# Patient Record
Sex: Male | Born: 1985 | Race: Black or African American | Hispanic: No | Marital: Married | Smoking: Never smoker
Health system: Southern US, Community
[De-identification: ages and names within clinical notes are randomized; demographics above are authoritative.]

## PROBLEM LIST (undated history)

## (undated) DIAGNOSIS — E119 Type 2 diabetes mellitus without complications: Secondary | ICD-10-CM

## (undated) DIAGNOSIS — F4310 Post-traumatic stress disorder, unspecified: Secondary | ICD-10-CM

## (undated) DIAGNOSIS — F3163 Bipolar disorder, current episode mixed, severe, without psychotic features: Secondary | ICD-10-CM

---
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
## 2007-11-16 ENCOUNTER — Emergency Department (HOSPITAL_COMMUNITY): Admission: EM | Admit: 2007-11-16 | Discharge: 2007-11-17 | Payer: Self-pay | Admitting: Emergency Medicine

## 2011-05-31 LAB — DIFFERENTIAL
Basophils Absolute: 0
Basophils Relative: 0
Eosinophils Absolute: 0
Eosinophils Relative: 0
Lymphocytes Relative: 4 — ABNORMAL LOW
Lymphs Abs: 0.3 — ABNORMAL LOW
Monocytes Absolute: 0.3
Monocytes Relative: 4
Neutro Abs: 7.1
Neutrophils Relative %: 91 — ABNORMAL HIGH

## 2011-05-31 LAB — COMPREHENSIVE METABOLIC PANEL
ALT: 29
AST: 30
Albumin: 4.5
Alkaline Phosphatase: 48
BUN: 16
CO2: 26
Calcium: 9.4
Chloride: 98
Creatinine, Ser: 1.27
GFR calc Af Amer: >60
GFR calc non Af Amer: >60
Glucose, Bld: 89
Potassium: 3.3 — ABNORMAL LOW
Sodium: 136
Total Bilirubin: 1.8 — ABNORMAL HIGH
Total Protein: 7.6

## 2011-05-31 LAB — LIPASE, BLOOD: Lipase: 18

## 2011-05-31 LAB — CBC
HCT: 50.3
Hemoglobin: 17.2 — ABNORMAL HIGH
MCHC: 34.2
MCV: 85.3
Platelets: 214
RBC: 5.9 — ABNORMAL HIGH
RDW: 12.9
WBC: 7.8

## 2018-08-12 ENCOUNTER — Emergency Department (HOSPITAL_COMMUNITY)
Admission: EM | Admit: 2018-08-12 | Discharge: 2018-08-13 | Disposition: A | Discharge status: Home / Self Care | Payer: Self-pay | Attending: Emergency Medicine | Admitting: Emergency Medicine

## 2018-08-12 ENCOUNTER — Other Ambulatory Visit: Payer: Self-pay

## 2018-08-12 DIAGNOSIS — F4310 Post-traumatic stress disorder, unspecified: Secondary | ICD-10-CM | POA: Insufficient documentation

## 2018-08-12 DIAGNOSIS — Z79899 Other long term (current) drug therapy: Secondary | ICD-10-CM | POA: Insufficient documentation

## 2018-08-12 DIAGNOSIS — F3113 Bipolar disorder, current episode manic without psychotic features, severe: Secondary | ICD-10-CM | POA: Insufficient documentation

## 2018-08-12 DIAGNOSIS — F3110 Bipolar disorder, current episode manic without psychotic features, unspecified: Secondary | ICD-10-CM | POA: Diagnosis present

## 2018-08-12 LAB — RAPID URINE DRUG SCREEN, HOSP PERFORMED
Amphetamines: NOT DETECTED
BENZODIAZEPINES: NOT DETECTED
Barbiturates: NOT DETECTED
Cocaine: NOT DETECTED
OPIATES: NOT DETECTED
TETRAHYDROCANNABINOL: POSITIVE — AB

## 2018-08-12 LAB — CBC WITH DIFFERENTIAL/PLATELET
ABS IMMATURE GRANULOCYTES: 0.02 10*3/uL (ref 0.00–0.07)
Basophils Absolute: 0 10*3/uL (ref 0.0–0.1)
Basophils Relative: 1 %
Eosinophils Absolute: 0 10*3/uL (ref 0.0–0.5)
Eosinophils Relative: 0 %
HEMATOCRIT: 49.6 % (ref 39.0–52.0)
Hemoglobin: 16.7 g/dL (ref 13.0–17.0)
IMMATURE GRANULOCYTES: 0 %
LYMPHS ABS: 1.8 10*3/uL (ref 0.7–4.0)
Lymphocytes Relative: 23 %
MCH: 29.5 pg (ref 26.0–34.0)
MCHC: 33.7 g/dL (ref 30.0–36.0)
MCV: 87.5 fL (ref 80.0–100.0)
MONO ABS: 0.5 10*3/uL (ref 0.1–1.0)
MONOS PCT: 6 %
NEUTROS ABS: 5.7 10*3/uL (ref 1.7–7.7)
NEUTROS PCT: 70 %
Platelets: 240 10*3/uL (ref 150–400)
RBC: 5.67 MIL/uL (ref 4.22–5.81)
RDW: 12.2 % (ref 11.5–15.5)
WBC: 8.1 10*3/uL (ref 4.0–10.5)
nRBC: 0 % (ref 0.0–0.2)

## 2018-08-12 LAB — COMPREHENSIVE METABOLIC PANEL
ALT: 30 U/L (ref 0–44)
ANION GAP: 14 (ref 5–15)
AST: 36 U/L (ref 15–41)
Albumin: 4.6 g/dL (ref 3.5–5.0)
Alkaline Phosphatase: 58 U/L (ref 38–126)
BILIRUBIN TOTAL: 1.1 mg/dL (ref 0.3–1.2)
BUN: 20 mg/dL (ref 6–20)
CO2: 23 mmol/L (ref 22–32)
Calcium: 9.8 mg/dL (ref 8.9–10.3)
Chloride: 105 mmol/L (ref 98–111)
Creatinine, Ser: 1.14 mg/dL (ref 0.61–1.24)
Glucose, Bld: 119 mg/dL — ABNORMAL HIGH (ref 70–99)
POTASSIUM: 4.1 mmol/L (ref 3.5–5.1)
Sodium: 142 mmol/L (ref 135–145)
TOTAL PROTEIN: 7.9 g/dL (ref 6.5–8.1)

## 2018-08-12 LAB — ACETAMINOPHEN LEVEL

## 2018-08-12 LAB — VALPROIC ACID LEVEL: Valproic Acid Lvl: 20 ug/mL — ABNORMAL LOW (ref 50.0–100.0)

## 2018-08-12 LAB — CBG MONITORING, ED: Glucose-Capillary: 130 mg/dL — ABNORMAL HIGH (ref 70–99)

## 2018-08-12 LAB — SALICYLATE LEVEL: Salicylate Lvl: <7 mg/dL (ref 2.8–30.0)

## 2018-08-12 LAB — ETHANOL: Alcohol, Ethyl (B): <10 mg/dL (ref <10)

## 2018-08-12 MED ORDER — NICOTINE 21 MG/24HR TD PT24: 21.0000 mg | MEDICATED_PATCH | Freq: Every day | TRANSDERMAL | Status: DC

## 2018-08-12 MED ORDER — LORAZEPAM 1 MG PO TABS
1.0000 mg | ORAL_TABLET | Freq: Once | ORAL | Status: AC
  Administered 2018-08-12: 1 mg via ORAL
  Filled 2018-08-12: qty 1

## 2018-08-12 MED ORDER — LORAZEPAM 1 MG PO TABS: 1.0000 mg | ORAL_TABLET | Freq: Once | ORAL | Status: DC

## 2018-08-12 NOTE — BH Assessment (Addendum)
Tele Assessment Note   Patient Name: Marvin Morgan MRN: [id number] Referring Physician: Ethelda ChickJacubowitz Location of [facility]: [facility] Location of Provider: Behavioral Health TTS Department  Marvin Morgan is an 32 y.o. male [facility] EMS [facility]. Patient's family is reportedly attempting to take out IVC paperwork. Per EDP note: "Marvin Morgan is a 32 y.o. male.  Patient has been increasingly agitated over the past 5 days.  He has not been taking any of his medications.  Mother also reports he has not slept in several days this morning several empty pill bottles were found.  He was found slumped over in a closet, awake, agitated.  Mother is uncertain whether he overdosed on medications or not.  Patient denies that he overdosed.  He reports that he presently feels somewhat agitated.  But he does not intent to harm himself or anyone else.  He was treated by EMS with Haldol 5 mg IM." Patient was guarded during assessment and refused to answer most of assessors questions. Patient was laying in bed with a blanket over his head that he refused to take off. Patient stated "I don't know why I'm here. I was talking junk I guess. I have medications but I can't get to them." When asked about SI patient denied. When asked about HI or any hallucinations or paranoia patient remained quiet and refused to answer. Clinician unable to complete entire assessment with patient due to his refusal to participate. Collateral information was obtained from patient's father, Marvin Morgan [phone]. He stated that his son is a disabled veteran who is not taking his medication. Patient is talking to himself and trying to "tear the entire house down." Patient has a prior diagnosis of bipolar disorder and PTSD. He has had [facility] in the [facility]. [place] this medications were prescribed. [place]  [place] [facility] [place] [facility] [place]. Patient has not slept in several days and is displaying bizarre and erratic behavior.  Patient was alert and oriented to self. Clinician was unable to complete entire mental status examine. Patient refused to remove blanket from his head during assessment. He chose to answer only some questions and stayed quiet. His mood was irritable.   Diagnosis: F31.13 Bipolar I disorder, current episode manic   F43.10 PTSD (per history)  Past Medical History: No past medical history on file.  Family History: No family history on file.  Social History:  has no tobacco, alcohol, and drug history on file.  Additional Social History:  Alcohol / Drug Use Pain Medications: see MAR Prescriptions: see MAR Over the Counter: see MAR History of alcohol / drug use?: No history of alcohol / drug abuse Longest period of sobriety (when/how long): UTA  CIWA: CIWA-Ar BP: (!) 137/97 Pulse Rate: 92 COWS:    Allergies: No Known Allergies  Home Medications:  (Not in a hospital admission)  OB/GYN Status:  No LMP for male patient.  General Assessment Data [facility] of Assessment: [facility] TTS Assessment: In system Is this a Tele or Face-to-Face Assessment?: Tele Assessment Is this an Initial Assessment or a Re-assessment for this encounter?: Initial Assessment Patient Accompanied by:: (self) Language Other than English: No Living Arrangements: Other (Comment) What gender do you identify as?: Male Marital status: Single Living Arrangements: Parent Can pt return to current living arrangement?: Yes Admission Status: Voluntary Is patient capable of signing voluntary admission?: No Referral Source: Self/Family/Friend  Insurance type: None     Crisis Care Plan Living Arrangements: Parent     Risk to self with the past 6 months Suicidal Ideation: No Has patient been a risk to self within the past 6 months prior to admission? : No Suicidal Intent: No Has  patient had any suicidal intent within the past 6 months prior to admission? : No Is patient at risk for suicide?: No Suicidal Plan?: No Has patient had any suicidal plan within the past 6 months prior to admission? : No Access to Means: No What has been your use of drugs/alcohol within the last 12 months?: patient denied Previous Attempts/Gestures: (UTA) How many times?: (UTA) Other Self Harm Risks: (none) Triggers for Past Attempts: Hallucinations Intentional Self Injurious Behavior: None Family Suicide History: Unable to assess Recent stressful life event(s): (UTA) Persecutory voices/beliefs?: (UTA) Depression: No Depression Symptoms: (UTA) Substance abuse history and/or treatment for substance abuse?: No Suicide prevention information given to non-admitted patients: Not applicable  Risk to Others within the past 6 months Homicidal Ideation: No Does patient have any lifetime risk of violence toward others beyond the six months prior to admission? : No Thoughts of Harm to Others: No Current Homicidal Intent: No Current Homicidal Plan: No Access to Homicidal Means: No Identified Victim: (n/a) History of harm to others?: No Assessment of Violence: None Noted Violent Behavior Description: none noted Does patient have access to weapons?: (UTA) Criminal Charges Pending?: No Does patient have a court date: No Is patient on probation?: No  Psychosis Hallucinations: Auditory Delusions: None noted  Mental Status Report Appearance/Hygiene: Unable to Assess Eye Contact: Poor Motor Activity: Freedom of movement Speech: Elective mutism Level of Consciousness: Alert Mood: Irritable Affect: Unable to Assess Anxiety Level: Moderate Thought Processes: Circumstantial Judgement: Impaired Orientation: Person, Place Obsessive Compulsive Thoughts/Behaviors: None  Cognitive Functioning Concentration: Unable to Assess Memory: Unable to Assess Is patient IDD: No Insight:  Poor Impulse Control: Poor Appetite: (UTA) Have you had any weight changes? : (UTA) Sleep: Decreased Total Hours of Sleep: 0 Vegetative Symptoms: Unable to Assess  ADLScreening [facility] Assessment Services) Patient's cognitive ability adequate to safely complete daily activities?: Yes Patient able to express need for assistance with ADLs?: Yes Independently performs ADLs?: Yes (appropriate for developmental age)  Prior Inpatient Therapy Prior Inpatient Therapy: Yes Prior Therapy Dates: 2019, 2018 Prior Therapy Facilty/Provider(s): [facility] Reason for Treatment: PTSD, bipolar disorder  Prior Outpatient Therapy Prior Outpatient Therapy: Yes Prior Therapy Dates: ongoing Prior Therapy Facilty/Provider(s): [facility] [place] Reason for Treatment: PTSD Does patient have an ACCT team?: No Does patient have Intensive In-House Services?  : No Does patient have [facility] services? : No Does patient have P4CC services?: No  ADL Screening (condition at time of admission) Patient's cognitive ability adequate to safely complete daily activities?: Yes Is the patient deaf or have difficulty hearing?: No Does the patient have difficulty seeing, even when wearing glasses/contacts?: No Does the patient have difficulty concentrating, remembering, or making decisions?: Yes Patient able to express need for assistance with ADLs?: Yes Does the patient have difficulty dressing or bathing?: No Independently performs ADLs?: Yes (appropriate for developmental age) Does the patient have difficulty walking or climbing stairs?: No Weakness of Legs: None Weakness of Arms/Hands: None  Home Assistive Devices/Equipment Home Assistive Devices/Equipment: None  Therapy Consults (therapy consults require a physician order) PT Evaluation Needed: No OT Evalulation Needed: No SLP Evaluation Needed: No Abuse/Neglect Assessment (Assessment to be complete while patient is alone) Abuse/Neglect  Assessment Can  Be Completed: Unable to assess, patient is non-responsive or altered mental status Values / Beliefs Cultural Requests During Hospitalization: None Spiritual Requests During Hospitalization: None Consults Spiritual Care Consult Needed: No Social Work Consult Needed: No Merchant navy officer (For Healthcare) Does Patient Have a Medical Advance Directive?: No          Disposition: Per Elta Guadeloupe, NP patient meets in patient criteria. TTS to secure placement. Disposition Initial Assessment Completed for this Encounter: Yes  This service was provided via telemedicine using a 2-way, interactive audio and video technology.  Names of all persons participating in this telemedicine service and their role in this encounter. Name: Cheree Ditto Role: patient  Name: Celedonio Miyamoto, [place] Role: TTS  Name:  Role:   Name:  Role:     Celedonio Miyamoto 08/12/2018 3:23 PM

## 2018-08-12 NOTE — ED Notes (Signed)
Bed: ZO10WA15 Expected date:  Expected time:  Means of arrival:  Comments: EMS Psych pt

## 2018-08-12 NOTE — ED Provider Notes (Addendum)
[facility]-EMERGENCY DEPT Provider Note   CSN: [id number] Arrival date & time: 08/12/18  0946     History   Chief Complaint Chief Complaint  Patient presents with  . Medical Clearance   Level 5 caveat psychiatric patient.  Patient does not answer all questions.  History is obtained from his mother by telephone at phone [phone], history is also obtained from EMS and from police who accompany him HPI Marvin Morgan is a 32 y.o. male.  Patient has been increasingly agitated over the past 5 days.  He has not been taking any of his medications.  Mother also reports he has not slept in several days this morning several empty pill bottles were found.  He was found slumped over in a closet, awake, agitated.  Mother is uncertain whether he overdosed on medications or not.  Patient denies that he overdosed.  He reports that he presently feels somewhat agitated.  But he does not intent to harm himself or anyone else.  He was treated by EMS with Haldol 5 mg IM.  HPI  No past medical history on file. Medical history diabetes, bipolar disorder There are no active problems to display for this patient.         Home Medications    Prior to Admission medications   Not on File  Home medications Depakote, metformin, possibly trazodone.  Otherwise unknown  Family History No family history on file.  Social History Social History   Tobacco Use  . Smoking status: Not on file  Substance Use Topics  . Alcohol use: Not on file  . Drug use: Not on file  Reports patient is a smoker.  Positive marijuana use.  Occasional alcohol   Allergies   Patient has no known allergies.   Review of Systems Review of Systems  Unable to perform ROS: Psychiatric disorder     Physical Exam Updated Vital Signs BP (!) 137/97 (BP Location: Right Arm)   Pulse 92   Temp 99.3 F (37.4 C) (Oral)   Resp 18   SpO2 95%   Physical Exam  Constitutional: He appears  well-developed and well-nourished.  Appears anxious  HENT:  Head: Normocephalic and atraumatic.  Eyes: Pupils are equal, round, and reactive to light. Conjunctivae are normal.  Neck: Neck supple. No tracheal deviation present. No thyromegaly present.  Cardiovascular: Normal rate and regular rhythm.  No murmur heard. Pulmonary/Chest: Effort normal and breath sounds normal.  Abdominal: Soft. Bowel sounds are normal. He exhibits no distension. There is no tenderness.  Musculoskeletal: Normal range of motion. He exhibits no edema or tenderness.  Neurological: He is alert. Coordination normal.  Simple commands, moves all extremities.  Does not answer all questions.  Gait normal.  Motor strength 5/5 overall cranial nerves II through XII grossly intact  Skin: Skin is warm and dry. No rash noted.  Psychiatric: He has a normal mood and affect.  Nursing note and vitals reviewed.    ED Treatments / Results  Labs (all labs ordered are listed, but only abnormal results are displayed) Labs Reviewed  ACETAMINOPHEN LEVEL  SALICYLATE LEVEL  COMPREHENSIVE METABOLIC PANEL  ETHANOL  RAPID URINE DRUG SCREEN, HOSP PERFORMED  CBC WITH DIFFERENTIAL/PLATELET    EKG EKG Interpretation  Date/Time:  Saturday August 12 2018 09:54:44 EST Ventricular Rate:  84 PR Interval:    QRS Duration: 92 QT Interval:  377 QTC Calculation: 446 R Axis:   26 Text Interpretation:  Sinus arrhythmia Nonspecific T abnormalities,  inferior leads No old tracing to compare Confirmed by EmeraldJacubowitz, Doreatha MartinSam [phone]([id number]) on 08/12/2018 10:58:28 AM   Radiology No results found.  Procedures Procedures (including critical care time)  Medications Ordered in ED Medications  LORazepam (ATIVAN) tablet 1 mg (has no administration in time range)    2 PM patient is alert ambulatory.  Talkative.  Appears less agitated after treatment with  oral Ativan Initial Impression / Assessment and Plan / ED Course  I have reviewed the triage  vital signs and the nursing notes.  Pertinent labs & imaging results that were available during my care of the patient were reviewed by me and considered in my medical decision making (see chart for details).     Patient is medically cleared for psychiatric evaluation. Patient likely suffering manic phase of bipolar disorder  Involuntary commitment affidavit followed by family.  First exam form filled out by me Final Clinical Impressions(s) / ED Diagnoses  Diagnosis #1 bipolar disorder manic #2 medication noncompliance  CRITICAL CARE Performed by: Doug SouSam Mattilyn Crites Total critical care time: 30 minutes Critical care time was exclusive of separately billable procedures and treating other patients. Critical care was necessary to treat or prevent imminent or life-threatening deterioration. Critical care was time spent personally by me on the following activities: development of treatment plan with patient and/or surrogate as well as nursing, discussions with consultants, evaluation of patient's response to treatment, examination of patient, obtaining history from patient or surrogate, ordering and performing treatments and interventions, ordering and review of laboratory studies, ordering and review of radiographic studies, pulse oximetry and re-evaluation of patient's condition. Final diagnoses:  None    ED Discharge Orders    None       Doug SouJacubowitz, Aadon Gorelik, MD 08/12/18 [id number]    Doug SouJacubowitz, Salisa Broz, MD 08/12/18 [phone]

## 2018-08-12 NOTE — ED Notes (Signed)
Will obtain labs once pt is more calm.

## 2018-08-12 NOTE — ED Triage Notes (Signed)
Pt arrives via EMS with [employer]--EMS called by family for erratic behavior. Upon arrival, pt was found barricaded in his closet under a pile of clothes. EMS was called 12/6 for psychotic episode, but was not transported. Hx of PTSD + BH, medication compliance unknown. Per GDP, family is trying to obtain IVC papers. Pt is A/O x 1-2. 5mg  haldol given by EMS L deltoid at 0922.

## 2018-08-13 DIAGNOSIS — F3110 Bipolar disorder, current episode manic without psychotic features, unspecified: Secondary | ICD-10-CM

## 2018-08-13 NOTE — Consult Note (Addendum)
[facility] Discharge  08/13/2018 12:43 PM Marvin LabradorDarius T Rodwell  MRN:  [id number] Principal Problem: Bipolar affective disorder, current episode manic [facility](HCC) Discharge Diagnoses: Principal Problem:   Bipolar affective disorder, current episode manic (HCC)   Subjective: Pt was seen and chart reviewed with treatment team and Dr Jannifer FranklinAkintayo. Pt denies suicidal/homicidal ideation, denies auditory/visual hallucinations and does not appear to be responding to internal stimuli. 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 Pt stated he does stay between his parents home and with his wife so he is technically not homeless but he considers himself homeless. His parents are supportive of him.  He stated he has not seen his children in one week and this upsets him. Pt stated he [place]% [place] from the [place] and that he has no money because it all goes to pay the bills. He denies that he tried to overdose and denies he was in a closet as the IVC papers say.Pt does have a prior suicide attempt by overdose but stated it was years ago and he is trying to leave that part of his life behind.  Pt admitted to smoking marijuana at times, UDS positive for THC and BAL negative. Pt is calm and cooperative, alert and oriented and is taking his Depakote as scheduled. [facility]. Pt is psychiatrically clear for discharge.   Total Time spent with patient: 30 minutes  Past Psychiatric History: As above  Family History: No family history on file. Family Psychiatric  History: Pt declined to answer Social History:  Social History   Substance and Sexual Activity  Alcohol Use Not on file    Social History   Substance and Sexual Activity  Drug Use Not on file   Social History   Socioeconomic History  . Marital status: Single    Spouse name: Not on file  . Number of children: Not on file  . Years of education: Not on file  .  Highest education level: Not on file  Occupational History  . Not on file  Social Needs  . Financial resource strain: Not on file  . Food insecurity:    Worry: Not on file    Inability: Not on file  . Transportation needs:    Medical: Not on file    Non-medical: Not on file  Tobacco Use  . Smoking status: Not on file  Substance and Sexual Activity  . Alcohol use: Not on file  . Drug use: Not on file  . Sexual activity: Not on file  Lifestyle  . Physical activity:    Days per week: Not on file    Minutes per session: Not on file  . Stress: Not on file  Relationships  . Social connections:    Talks on phone: Not on file    Gets together: Not on file    Attends religious service: Not on file    Active member of club or organization: Not on file    Attends meetings of clubs or organizations: Not on file    Relationship status: Not on file  Other Topics Concern  . Not on file  Social History Narrative  . Not on file    Has this patient used any form of tobacco in the last 30 days? (Cigarettes, Smokeless Tobacco, Cigars, and/or Pipes) Prescription not provided because: Pt denies tobacco use.   Current Medications: Current Facility-Administered Medications  Medication  Dose Route Frequency Provider Last Rate Last Dose  . nicotine (NICODERM CQ - dosed in mg/24 hours) patch 21 mg  21 mg Transdermal Daily Doug Sou, MD       No current outpatient medications on file.   Musculoskeletal: Strength & Muscle Tone: within normal limits Gait & Station: normal Patient leans: N/A  Psychiatric Specialty Exam: Physical Exam  ROS  Blood pressure 130/86, pulse 86, temperature 98.8 F (37.1 C), temperature source Oral, resp. rate 18, SpO2 97 %.There is no height or weight on file to calculate BMI.  General Appearance: Casual  Eye Contact:  Good  Speech:  Clear and Coherent and Normal Rate  Volume:  Decreased  Mood:  Depressed  Affect:  Congruent  Thought Process:  Coherent,  Goal Directed, Linear and Descriptions of Associations: Intact  Orientation:  Full (Time, Place, and Person)  Thought Content:  Logical  Suicidal Thoughts:  No  Homicidal Thoughts:  No  Memory:  Immediate;   Good Recent;   Good Remote;   Fair  Judgement:  Fair  Insight:  Fair  Psychomotor Activity:  Normal  Concentration:  Concentration: Good and Attention Span: Good  Recall:  Good  Fund of Knowledge:  Good  Language:  Good  Akathisia:  No  Handed:  Right  AIMS (if indicated):     Assets:  Solicitor Physical Health Social Support Transportation Vocational/Educational  ADL's:  Intact  Cognition:  WNL  Sleep:        Demographic Factors:  Male and Adolescent or young adult  Loss Factors: Financial problems/change in socioeconomic status  Historical Factors: Family history of mental illness or substance abuse  Risk Reduction Factors:   Responsible for children under 26 years of age, Sense of responsibility to family, Living with another person, especially a relative, Positive social support and Positive therapeutic relationship  Continued Clinical Symptoms:  Bipolar Disorder:   Depressive phase  Cognitive Features That Contribute To Risk:  Closed-mindedness    Suicide Risk:  Minimal: No identifiable suicidal ideation.  Patients presenting with no risk factors but with morbid ruminations; may be classified as minimal risk based on the severity of the depressive symptoms    Plan Of Care/Follow-up recommendations:  Activity:  as tolerated Diet:  Heart healthy  Disposition:  Bipolar affective disorder, current episode manic (HCC) Take all medications as prescribed by the [facility]. Keep all follow-up appointments as scheduled with the [facility].  Do not consume alcohol or use illegal drugs while on prescription medications. Report any adverse effects from your medications to your primary care provider  promptly.  In the event of recurrent symptoms or worsening symptoms, call 911, a crisis hotline, or go to the nearest emergency department for evaluation.   Laveda Abbe, NP 08/13/2018, 12:43 PM

## 2018-08-13 NOTE — ED Notes (Signed)
Belongings locked up in locker #36. Gold shirt, sneakers-silver & blue, blue shorts.

## 2018-08-13 NOTE — Discharge Instructions (Signed)
For your mental health and medication needs, please follow up with:  [facility] [address] [place], [place] [id number] [phone]

## 2018-08-13 NOTE — ED Notes (Signed)
Pt discharged safely with [facility] [place].  All belongings were returned to pt.

## 2018-10-29 ENCOUNTER — Emergency Department (HOSPITAL_COMMUNITY)
Admission: EM | Admit: 2018-10-29 | Discharge: 2018-11-01 | Disposition: A | Discharge status: Other Acute Inpatient Hospital | Payer: Self-pay | Attending: Emergency Medicine | Admitting: Emergency Medicine

## 2018-10-29 DIAGNOSIS — F29 Unspecified psychosis not due to a substance or known physiological condition: Secondary | ICD-10-CM

## 2018-10-29 DIAGNOSIS — F312 Bipolar disorder, current episode manic severe with psychotic features: Secondary | ICD-10-CM | POA: Insufficient documentation

## 2018-10-29 DIAGNOSIS — R4182 Altered mental status, unspecified: Secondary | ICD-10-CM | POA: Insufficient documentation

## 2018-10-29 LAB — CBC WITH DIFFERENTIAL/PLATELET
Abs Immature Granulocytes: 0.02 10*3/uL (ref 0.00–0.07)
Basophils Absolute: 0 10*3/uL (ref 0.0–0.1)
Basophils Relative: 0 %
Eosinophils Absolute: 0 10*3/uL (ref 0.0–0.5)
Eosinophils Relative: 0 %
HCT: 48.4 % (ref 39.0–52.0)
Hemoglobin: 15.8 g/dL (ref 13.0–17.0)
Immature Granulocytes: 0 %
Lymphocytes Relative: 20 %
Lymphs Abs: 1.8 10*3/uL (ref 0.7–4.0)
MCH: 28.7 pg (ref 26.0–34.0)
MCHC: 32.6 g/dL (ref 30.0–36.0)
MCV: 88 fL (ref 80.0–100.0)
Monocytes Absolute: 0.5 10*3/uL (ref 0.1–1.0)
Monocytes Relative: 6 %
Neutro Abs: 6.7 10*3/uL (ref 1.7–7.7)
Neutrophils Relative %: 74 %
Platelets: 217 10*3/uL (ref 150–400)
RBC: 5.5 MIL/uL (ref 4.22–5.81)
RDW: 12.2 % (ref 11.5–15.5)
WBC: 9.1 10*3/uL (ref 4.0–10.5)
nRBC: 0 % (ref 0.0–0.2)

## 2018-10-29 LAB — COMPREHENSIVE METABOLIC PANEL
ALT: 37 U/L (ref 0–44)
AST: 62 U/L — ABNORMAL HIGH (ref 15–41)
Albumin: 4.1 g/dL (ref 3.5–5.0)
Alkaline Phosphatase: 43 U/L (ref 38–126)
Anion gap: 10 (ref 5–15)
BUN: 18 mg/dL (ref 6–20)
CO2: 24 mmol/L (ref 22–32)
Calcium: 9.3 mg/dL (ref 8.9–10.3)
Chloride: 106 mmol/L (ref 98–111)
Creatinine, Ser: 1.28 mg/dL — ABNORMAL HIGH (ref 0.61–1.24)
GFR calc Af Amer: >60 mL/min (ref >60)
GFR calc non Af Amer: >60 mL/min (ref >60)
Glucose, Bld: 105 mg/dL — ABNORMAL HIGH (ref 70–99)
Potassium: 3.7 mmol/L (ref 3.5–5.1)
Sodium: 140 mmol/L (ref 135–145)
Total Bilirubin: 0.9 mg/dL (ref 0.3–1.2)
Total Protein: 7.1 g/dL (ref 6.5–8.1)

## 2018-10-29 LAB — ETHANOL: Alcohol, Ethyl (B): <10 mg/dL (ref <10)

## 2018-10-29 MED ORDER — ZIPRASIDONE MESYLATE 20 MG IM SOLR
20.0000 mg | Freq: Once | INTRAMUSCULAR | Status: AC
Start: 2018-10-29 — End: 2018-10-29
  Administered 2018-10-29: 20 mg via INTRAMUSCULAR
  Filled 2018-10-29: qty 20

## 2018-10-29 MED ORDER — HALOPERIDOL LACTATE 5 MG/ML IJ SOLN
5.0000 mg | Freq: Once | INTRAMUSCULAR | Status: AC
  Administered 2018-10-29: 5 mg via INTRAMUSCULAR
  Filled 2018-10-29: qty 1

## 2018-10-29 MED ORDER — DIPHENHYDRAMINE HCL 50 MG/ML IJ SOLN
50.0000 mg | Freq: Once | INTRAMUSCULAR | Status: AC
  Administered 2018-10-29: 50 mg via INTRAMUSCULAR
  Filled 2018-10-29: qty 1

## 2018-10-29 MED ORDER — LORAZEPAM 2 MG/ML IJ SOLN
2.0000 mg | Freq: Once | INTRAMUSCULAR | Status: AC
  Administered 2018-10-29: 2 mg via INTRAMUSCULAR
  Filled 2018-10-29: qty 1

## 2018-10-29 MED ORDER — STERILE WATER FOR INJECTION IJ SOLN
INTRAMUSCULAR | Status: AC
  Filled 2018-10-29: qty 10

## 2018-10-29 NOTE — ED Notes (Signed)
Purple scrub pants placed on pt.

## 2018-10-29 NOTE — ED Notes (Signed)
Sitter at [employer] all restraints removed

## 2018-10-29 NOTE — Progress Notes (Signed)
Nurse, Thayer Ohm stated that pt was given B52 for sedation. Pt is asleep and cannot be assessed at this time.

## 2018-10-29 NOTE — ED Notes (Signed)
IVC papers faxed to Magistrate - verified receipt.  

## 2018-10-29 NOTE — ED Notes (Signed)
gpd just left  Sitter requested ivcd

## 2018-10-29 NOTE — ED Notes (Signed)
Face mask removed. Pt sleeping.
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
## 2018-10-29 NOTE — ED Notes (Signed)
Both leg restraints removed at this time.

## 2018-10-29 NOTE — ED Notes (Signed)
All restraints removed  Pt sedated trying to get comfortable

## 2018-10-29 NOTE — ED Notes (Signed)
IVC papers served - copy faxed to [facility], copy sent to Medical Records, original placed in folder for Magistrate, and all 3 sets on clipboard.  

## 2018-10-29 NOTE — ED Notes (Signed)
Pt undressed and all belongings placed on bag and given to RN.

## 2018-10-29 NOTE — ED Notes (Signed)
Pt more calm  Sleeping  Intermittent;y
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
## 2018-10-30 LAB — RAPID URINE DRUG SCREEN, HOSP PERFORMED
Amphetamines: NOT DETECTED
Barbiturates: NOT DETECTED
Benzodiazepines: POSITIVE — AB
Cocaine: NOT DETECTED
Opiates: NOT DETECTED
Tetrahydrocannabinol: NOT DETECTED

## 2018-10-30 MED ORDER — ZIPRASIDONE MESYLATE 20 MG IM SOLR: 20.0000 mg | Freq: Once | INTRAMUSCULAR | Status: DC | PRN

## 2018-10-30 MED ORDER — LORAZEPAM 1 MG PO TABS
2.0000 mg | ORAL_TABLET | Freq: Once | ORAL | Status: AC
  Administered 2018-10-30: 2 mg via ORAL
  Filled 2018-10-30: qty 2

## 2018-10-30 MED ORDER — DIVALPROEX SODIUM ER 500 MG PO TB24
1500.0000 mg | ORAL_TABLET | Freq: Every day | ORAL | Status: DC
  Administered 2018-10-30 – 2018-10-31 (×2): 1500 mg via ORAL
  Filled 2018-10-30 (×2): qty 3

## 2018-10-30 NOTE — ED Notes (Signed)
Patient approached RN station demanded to have his nightly Trazodone and an unknown medication; pt is asked if he has a RX for Trazodone as it does not appear on medication list previously reviewed by Pharmacy Tech; pt was unable to confirm Rx for Trazodone; pt was advised of medication he will be receiving tonight; Pt is anxious and continues to come to RN station asking for things; pt has been given paper to "doodle" at his request and a crayon; Sitter remains able to view patient-Monique,RN

## 2018-10-30 NOTE — ED Notes (Signed)
Pt out of room, stating he wants to go home. Redirected to room, explained that he is involuntarily committed. Pt states "always. How long?" This RN stated that I do not know at this time how long pt will be here/IVC'd. Pt states "cool. Let me know." and got back in bed.

## 2018-10-30 NOTE — ED Notes (Signed)
Father at bedside, wanded by security
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
## 2018-10-30 NOTE — Progress Notes (Signed)
Nurse, Georga Hacking stated that pt is still sedated at this time.

## 2018-10-30 NOTE — Progress Notes (Signed)
Pt meets inpatient criteria per Hillery Jacks, NP. Referral information has been sent to the following hospitals for review:  [facility]  [facility]  [facility]  [facility]  [facility]  [facility]  [facility]  [facility]  [facility]   Disposition will continue to assist with inpatient placement needs.   Wells Guiles, LCSW, LCAS Disposition CSW [facility]/TTS [phone]

## 2018-10-30 NOTE — ED Notes (Signed)
Pt has made two phone calls, expresses understanding of two phone call policy. Both phone calls were along the lines of him instructing whoever he was calling to get him out of the hospital. Exhibiting pacing behaviors, advised to exercise such as doing push ups, does not want to do, walking hall with sitter. PO ativan given, security at nurse's station as a precaution given prior escalation. Calm and cooperative at this time.

## 2018-10-30 NOTE — ED Notes (Signed)
Breakfast Tray ordered  
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
## 2018-10-30 NOTE — ED Notes (Signed)
Pt awake, calm and cooperative at this time.

## 2018-10-30 NOTE — [facility] Counselor (Addendum)
Per Neldon Labella, RN patient continues to be sedated. RN stated she would contact TTS when patient is alert.  9:53: Emmy RN informed TTS patient is awake.

## 2018-10-31 ENCOUNTER — Other Ambulatory Visit: Payer: Self-pay

## 2018-10-31 ENCOUNTER — Encounter (HOSPITAL_COMMUNITY): Payer: Self-pay | Admitting: Registered Nurse

## 2018-10-31 LAB — CBG MONITORING, ED: Glucose-Capillary: 87 mg/dL (ref 70–99)

## 2018-10-31 MED ORDER — TRAZODONE HCL 50 MG PO TABS
100.0000 mg | ORAL_TABLET | Freq: Every day | ORAL | Status: DC
  Administered 2018-10-31: 100 mg via ORAL
  Filled 2018-10-31: qty 1

## 2018-10-31 MED ORDER — LORAZEPAM 1 MG PO TABS
2.0000 mg | ORAL_TABLET | Freq: Once | ORAL | Status: AC
  Administered 2018-10-31: 2 mg via ORAL
  Filled 2018-10-31: qty 2
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
## 2018-10-31 NOTE — BH Assessment (Signed)
[facility] Assessment Progress Note    Attempted to re-assess patient, but he is currently asleep.  Kriste Basque, RN will call TTS when he awakens.

## 2018-10-31 NOTE — ED Notes (Signed)
Pt asking repeatedly for his home meds - Trazodone and Prazosin. States he does not want to be given meds that he has not been taking. States he does not feel he needs Inpt tx - pt aware [facility] seeking Placement w/[facility].

## 2018-10-31 NOTE — ED Notes (Signed)
Pt showered. Per Shuvon, NP, [facility], note - recommends to continue Geodon prn for agitation.

## 2018-10-31 NOTE — ED Notes (Signed)
Pt made phone call from phone at nurses' desk at 0720. Pt now stating meds he is being given are not working. [facility] advised will ask NP [facility] for med recommendations.

## 2018-10-31 NOTE — ED Notes (Signed)
Pt sleeping. [facility] attempting to reassess. Will call them back when pt awakens.

## 2018-10-31 NOTE — ED Notes (Signed)
Re-TTS being performed.  

## 2018-10-31 NOTE — Consult Note (Signed)
  Medication Recommendation  Maryclare Labrador, 33 y.o., male patient; chart reviewed and discussed with Dr. Lucianne Muss on 10/31/18.  Patient was restarted on Depakote 1,500 mg Q hs on Monday 10/30/18.  For agitation can continue with Geodon; would suggest a baseline EKG be done to rule out QT prolongation.  No changes in medication at this time related to restart of Depakote.  Will need to check Valproic acid level in 3 days to see what level are and if increase is needed.  No noted level at this time.  Last noted level 08/12/18 (20)   Shuvon B. Rankin, NP

## 2018-10-31 NOTE — ED Notes (Signed)
Pt requesting med x 3. Advised pt NP [facility] will be reviewing his chart and ordering meds. Pt initially asking for meds for anxiety. Now states, "I don't need the medicine for anxiety. I need it because I have a sleep disorder and having problems sleeping". Offered for pt to be given Geodon - pt declined d/t injection. Advised will wait for po meds.

## 2018-10-31 NOTE — ED Notes (Signed)
Dinner ordered 

## 2018-10-31 NOTE — ED Notes (Signed)
Pt's parents visiting. 

## 2018-10-31 NOTE — Progress Notes (Signed)
CSW received confirmation from admissions @ [place] that all required paperwork has been received. Admissions will contact Disposition after their psychiatrist reviews pt's referral packet.   Wells Guiles, LCSW, LCAS Disposition CSW [facility]/TTS [phone]

## 2018-10-31 NOTE — ED Notes (Signed)
Pt ambulated to bathroom and back room. TTS set up for pt for Re-TTS.

## 2018-10-31 NOTE — ED Notes (Signed)
Patient was given A Cup of Ginger Ale.

## 2018-10-31 NOTE — ED Notes (Addendum)
Pt noted to be restless - ambulatory to nurses' desk flipping through pages in magazine that he had already read. Stated he wanted to read it at the desk. Offered for pt to take it to his room. Pt stated "I have enough in my room to read". Pt returned to room as instructed.

## 2018-10-31 NOTE — ED Notes (Signed)
Pt ambulatory to nurses' desk stating he needs his meds especially for anxiety. When asked which meds, states the [place] administers his meds and that the information is "classified". Pt then advised he takes Prazosin and Trazodone. [facility] aware - advised Shuvon, NP, [facility], will review.

## 2018-10-31 NOTE — ED Notes (Signed)
Regular Diet was ordered for Lunch. 

## 2018-10-31 NOTE — Progress Notes (Signed)
Disposition CSW received a call this morning from Tomma Rakers, Care Coordinator with the [facility], [phone] x [phone]), who notes that patient is seen at their facility and that she has been in touch with the [facility] and they have available treatment beds.  She notes that patient is covered 100% by [employer].  CSW contacted April Alexander, Patient Transfer Coordinator at the [facility], and confirmed bed availability.  CSW will prepare VA paperwork for physician signature.  Timmothy Euler. Kaylyn Lim, MSW, LCSWA Disposition Clinical Social Work [phone] (cell) [phone] (office)

## 2018-11-01 NOTE — ED Notes (Signed)
Pt updated about treatment plan

## 2018-11-01 NOTE — ED Provider Notes (Addendum)
Patient alert, content, nad. BH team indicates pt accepted to inpatient psych, [facility], Dr Lanna Poche.  Pt currently appears stable for transfer.      Cathren Laine, MD 11/01/18 1222  Recheck 1340, pt comfortable appearing, nad, vitals normal. Pt continues to appear stable for transfer.        Cathren Laine, MD 11/01/18 1350

## 2018-11-01 NOTE — ED Notes (Signed)
Regular Diet was ordered for Lunch. 
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
## 2018-11-01 NOTE — Discharge Instructions (Addendum)
Transfer to [place].

## 2018-11-01 NOTE — ED Notes (Signed)
Report given to Marcelino Duster, Charity fundraiser at [facility]

## 2018-11-01 NOTE — ED Notes (Signed)
Pt requesting to take a shower- supplies given.

## 2018-11-01 NOTE — ED Notes (Signed)
Breakfast Tray Ordered. 

## 2018-11-01 NOTE — ED Notes (Signed)
Breakfast tray ordered 

## 2018-11-10 ENCOUNTER — Emergency Department (HOSPITAL_COMMUNITY)
Admission: EM | Admit: 2018-11-10 | Discharge: 2018-11-15 | Disposition: A | Discharge status: Home / Self Care | Payer: Self-pay | Attending: Emergency Medicine | Admitting: Emergency Medicine

## 2018-11-10 DIAGNOSIS — R51 Headache: Secondary | ICD-10-CM | POA: Insufficient documentation

## 2018-11-10 DIAGNOSIS — F319 Bipolar disorder, unspecified: Secondary | ICD-10-CM

## 2018-11-10 DIAGNOSIS — R45850 Homicidal ideations: Secondary | ICD-10-CM | POA: Insufficient documentation

## 2018-11-10 DIAGNOSIS — F312 Bipolar disorder, current episode manic severe with psychotic features: Secondary | ICD-10-CM | POA: Insufficient documentation

## 2018-11-10 DIAGNOSIS — Z046 Encounter for general psychiatric examination, requested by authority: Secondary | ICD-10-CM | POA: Insufficient documentation

## 2018-11-10 HISTORY — DX: Bipolar disorder, current episode mixed, severe, without psychotic features: F31.63

## 2018-11-10 HISTORY — DX: Post-traumatic stress disorder, unspecified: F43.10

## 2018-11-10 LAB — ACETAMINOPHEN LEVEL: Acetaminophen (Tylenol), Serum: <10 ug/mL — ABNORMAL LOW (ref 10–30)

## 2018-11-10 LAB — COMPREHENSIVE METABOLIC PANEL
ALT: 26 U/L (ref 0–44)
ANION GAP: 12 (ref 5–15)
AST: 43 U/L — ABNORMAL HIGH (ref 15–41)
Albumin: 4.1 g/dL (ref 3.5–5.0)
Alkaline Phosphatase: 47 U/L (ref 38–126)
BUN: 13 mg/dL (ref 6–20)
CO2: 23 mmol/L (ref 22–32)
Calcium: 9.4 mg/dL (ref 8.9–10.3)
Chloride: 102 mmol/L (ref 98–111)
Creatinine, Ser: 1.18 mg/dL (ref 0.61–1.24)
GFR calc non Af Amer: >60 mL/min (ref >60)
Glucose, Bld: 90 mg/dL (ref 70–99)
Potassium: 4.9 mmol/L (ref 3.5–5.1)
Sodium: 137 mmol/L (ref 135–145)
TOTAL PROTEIN: 7 g/dL (ref 6.5–8.1)
Total Bilirubin: 1.1 mg/dL (ref 0.3–1.2)

## 2018-11-10 LAB — CBC WITH DIFFERENTIAL/PLATELET
Abs Immature Granulocytes: 0.03 10*3/uL (ref 0.00–0.07)
Basophils Absolute: 0.1 10*3/uL (ref 0.0–0.1)
Basophils Relative: 1 %
Eosinophils Absolute: 0 10*3/uL (ref 0.0–0.5)
Eosinophils Relative: 0 %
HCT: 42.9 % (ref 39.0–52.0)
Hemoglobin: 14.4 g/dL (ref 13.0–17.0)
IMMATURE GRANULOCYTES: 0 %
Lymphocytes Relative: 17 %
Lymphs Abs: 1.8 10*3/uL (ref 0.7–4.0)
MCH: 29.4 pg (ref 26.0–34.0)
MCHC: 33.6 g/dL (ref 30.0–36.0)
MCV: 87.6 fL (ref 80.0–100.0)
Monocytes Absolute: 0.8 10*3/uL (ref 0.1–1.0)
Monocytes Relative: 7 %
NEUTROS PCT: 75 %
Neutro Abs: 7.7 10*3/uL (ref 1.7–7.7)
PLATELETS: 198 10*3/uL (ref 150–400)
RBC: 4.9 MIL/uL (ref 4.22–5.81)
RDW: 12.3 % (ref 11.5–15.5)
WBC: 10.4 10*3/uL (ref 4.0–10.5)
nRBC: 0 % (ref 0.0–0.2)

## 2018-11-10 LAB — RAPID URINE DRUG SCREEN, HOSP PERFORMED
Amphetamines: NOT DETECTED
Barbiturates: NOT DETECTED
Benzodiazepines: NOT DETECTED
Cocaine: NOT DETECTED
Opiates: NOT DETECTED
Tetrahydrocannabinol: POSITIVE — AB

## 2018-11-10 LAB — SALICYLATE LEVEL: Salicylate Lvl: <7 mg/dL (ref 2.8–30.0)

## 2018-11-10 LAB — ETHANOL: Alcohol, Ethyl (B): <10 mg/dL (ref <10)

## 2018-11-10 MED ORDER — STERILE WATER FOR INJECTION IJ SOLN
INTRAMUSCULAR | Status: AC
  Administered 2018-11-10: 1 mL
  Filled 2018-11-10: qty 10

## 2018-11-10 MED ORDER — ZIPRASIDONE MESYLATE 20 MG IM SOLR
20.0000 mg | Freq: Once | INTRAMUSCULAR | Status: AC
  Administered 2018-11-10: 20 mg via INTRAMUSCULAR
  Filled 2018-11-10: qty 20

## 2018-11-10 MED ORDER — LORAZEPAM 2 MG/ML IJ SOLN
2.0000 mg | Freq: Once | INTRAMUSCULAR | Status: AC
  Administered 2018-11-10: 2 mg via INTRAMUSCULAR
  Filled 2018-11-10: qty 1

## 2018-11-10 MED ORDER — HALOPERIDOL LACTATE 5 MG/ML IJ SOLN
10.0000 mg | Freq: Once | INTRAMUSCULAR | Status: AC
  Administered 2018-11-10: 10 mg via INTRAMUSCULAR
  Filled 2018-11-10: qty 2

## 2018-11-10 NOTE — ED Notes (Signed)
Pt demanding to leave and be allowed to go outside and smoke a cigarette. Pt exhibiting obvious manic behavior with stream of consciousness conversation that doesn't stop and doesn't follow very coherent thought processes. Pt's mood swings from crying to anger quickly. Cursing at staff, threatening to leave. Jodi Mourning, MD aware.

## 2018-11-10 NOTE — ED Notes (Signed)
Patient pacing in hallway, talking to himself and will not sit down when asked to.
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
## 2018-11-10 NOTE — ED Notes (Signed)
This tech came out of one of the exam rooms finding the pt walking around the hallway cussing and yelling at everyone. This tech asked him to go back to his bed and have a seat. Pt said, "Fuck you! Suck my dick!" Pt is currently back in his bed, rambling and making vulgar comments.

## 2018-11-10 NOTE — ED Triage Notes (Signed)
Patient in via [employer] from home - father reported to police that patient was off meds again, "talking out of his head," and threatened to kill him, as well as report suicidal ideation. Patient stated to MD, "I took stuff to try and hurt myself too." Patient in voluntarily at the moment, but recently seen for psychosis and transferred to [facility] for inpatient treatment. Patient cooperative in triage. [employer] remains at bedside. He denies pain, resp e/u, skin w/d.

## 2018-11-10 NOTE — ED Notes (Signed)
Pt resting at this time.

## 2018-11-10 NOTE — BH Assessment (Addendum)
Per Alexa, RN no rooms available for TTS to telepsych patient. States she will contact us when a room becomes available.  1807: Alexa, RN brought patient into room to be assessed. Patient laid in bed with eyes open refusing to speak. This clinician stated that in order to be discharged he would have to speak with TTS. Patient continued to lay silently, refusing to speak with TTS.

## 2018-11-10 NOTE — ED Provider Notes (Signed)
[facility] EMERGENCY DEPARTMENT Provider Note   CSN: [id number] Arrival date & time:       History   Chief Complaint Chief Complaint  Patient presents with  . IVC    HPI Marvin Morgan is a 33 y.o. male.     The history is provided by the patient, medical records and the police. No language interpreter was used.   Marvin Morgan is a 33 y.o. male who presents to the Emergency Department complaining of psychiatric evaluation. He presents to the emergency department accompanied by police voluntarily for psychiatric evaluation. Police reports that family plans to take out IVC. Level V caveat due to psychiatric illness. Patient is unable to provide any history aside from he lives in his truck and is currently homeless. He does smoke cigarettes, marijuana and drinks alcohol. He states he takes his medications. He states he took [place] today. No past medical history on file.  Patient Active Problem List   Diagnosis Date Noted  . Bipolar affective disorder, current episode manic (HCC)     No past surgical history on file.      Home Medications    Prior to Admission medications   Medication Sig Start Date End Date Taking? Authorizing Provider  divalproex (DEPAKOTE ER) 500 MG 24 hr tablet Take 1,500 mg by mouth at bedtime.    [provider]    Family History No family history on file.  Social History Social History   Tobacco Use  . Smoking status: Not on file  Substance Use Topics  . Alcohol use: Not on file  . Drug use: Not on file     Allergies   Patient has no known allergies.   Review of Systems Review of Systems  All other systems reviewed and are negative.    Physical Exam Updated Vital Signs BP 118/76 (BP Location: Right Arm)   Pulse (!) 102   Temp 98.8 F (37.1 C) (Oral)   Resp (!) 22   SpO2 99%   Physical Exam Vitals signs and nursing note reviewed.  Constitutional:      Appearance: He is  well-developed.  HENT:     Head: Normocephalic and atraumatic.  Cardiovascular:     Rate and Rhythm: Normal rate and regular rhythm.  Pulmonary:     Effort: Pulmonary effort is normal. No respiratory distress.  Musculoskeletal: Normal range of motion.  Skin:    General: Skin is warm.  Neurological:     Mental Status: He is alert and oriented to person, place, and time.  Psychiatric:     Comments: Agitated with frequent pacing.  Loud, pressured speech with flight of ideas      ED Treatments / Results  Labs (all labs ordered are listed, but only abnormal results are displayed) Labs Reviewed  COMPREHENSIVE METABOLIC PANEL - Abnormal; Notable for the following components:      Result Value   AST 43 (*)    All other components within normal limits  ACETAMINOPHEN LEVEL - Abnormal; Notable for the following components:   Acetaminophen (Tylenol), Serum <10 (*)    All other components within normal limits  RAPID URINE DRUG SCREEN, HOSP PERFORMED - Abnormal; Notable for the following components:   Tetrahydrocannabinol POSITIVE (*)    All other components within normal limits  CBC WITH DIFFERENTIAL/PLATELET  ETHANOL  SALICYLATE LEVEL    EKG EKG Interpretation  Date/Time:  Friday November 10 2018 17:03:38 EST Ventricular Rate:  84 PR Interval:  140 QRS Duration: 86 QT Interval:  378 QTC Calculation: 446 R Axis:   58 Text Interpretation:  Normal sinus rhythm Normal ECG Confirmed by Tilden Fossa [phone]) on 11/10/2018 6:01:00 PM   Radiology No results found.  Procedures Procedures (including critical care time)  Medications Ordered in ED Medications  ziprasidone (GEODON) injection 20 mg (20 mg Intramuscular Given by Other 11/10/18 1435)  sterile water (preservative free) injection (1 mL  Given 11/10/18 1435)     Initial Impression / Assessment and Plan / ED Course  I have reviewed the triage vital signs and the nursing notes.  Pertinent labs & imaging results that were  available during my care of the patient were reviewed by me and considered in my medical decision making (see chart for details).        Pt presents to the ED with agitation, psychosis, hx/o bipolar d/o.  IVC completed for patient safety and he was sedated with Geodon.  He has been medically cleared for psychiatric evaluation and treatment.    Final Clinical Impressions(s) / ED Diagnoses   Final diagnoses:  None    ED Discharge Orders    None       Tilden Fossa, MD 11/10/18 1911

## 2018-11-10 NOTE — ED Notes (Signed)
Pt has been wanded by security. 

## 2018-11-10 NOTE — ED Notes (Signed)
Pt changing into purple scrubs at this time.  

## 2018-11-10 NOTE — ED Notes (Addendum)
Pt is talking to no one in particular and hasn't stopped. Pts sentences aren't making much sense, rambling about one topic and then jumping to another topic. Pt talks about sex, marijuana, wanting to go home, the [employer], buying his children stuff, being suicidal for 30 years and more.

## 2018-11-10 NOTE — BH Assessment (Signed)
TTS obtained collateral information from patient's father, Marvin Morgan [phone]. He states since patient was released from [place] last week he "has not been doing what he needs to be doing." He reports patient has "escalated into a maniac. Not sleeping, not taking medications, pacing, taking off clothes." He states he contacted the [facility] and they recommended bringing him to ED for evaluation and potential readmission to their hospital. Patient's contact at the [facility] is Cedric Fishman [phone] ext. [phone].

## 2018-11-10 NOTE — ED Notes (Signed)
Pt crying at this point and saluting in the hallway.

## 2018-11-10 NOTE — ED Provider Notes (Signed)
Patient's care transferred over to [facility] pod for close monitoring until mentally stable for TTS assessment.  Patient gradually became more aggressive, clinically concern for worsening bipolar symptoms and mania.  Patient pacing around the hallway trying to leave.  Patient has IVC paperwork filled out due to his worsening psychiatric signs and symptoms.  Patient has not stable to leave the emergency room and does need professional assessment by psychiatry colleagues.  Verbal de-escalation attempted.  Intramuscular Haldol and Ativan required to control symptoms.  Discussed with nursing staff and security guards at bedside.  CRITICAL CARE Performed by: Enid Skeens  Total critical care time: 35 minutes  Critical care time was exclusive of separately billable procedures and treating other patients.  Critical care was necessary to treat or prevent imminent or life-threatening deterioration.  Critical care was time spent personally by me on the following activities: development of treatment plan with patient and/or surrogate as well as nursing, discussions with consultants, evaluation of patient's response to treatment, examination of patient, obtaining history from patient or surrogate, ordering and performing treatments and interventions, ordering and review of laboratory studies, ordering and review of radiographic studies, pulse oximetry and re-evaluation of patient's condition.     Blane Ohara, MD 11/10/18 2233

## 2018-11-11 ENCOUNTER — Other Ambulatory Visit: Payer: Self-pay

## 2018-11-11 ENCOUNTER — Encounter (HOSPITAL_COMMUNITY): Payer: Self-pay | Admitting: *Deleted

## 2018-11-11 MED ORDER — DIVALPROEX SODIUM ER 500 MG PO TB24
1500.0000 mg | ORAL_TABLET | Freq: Every day | ORAL | Status: DC
  Filled 2018-11-11 (×2): qty 3

## 2018-11-11 MED ORDER — LORAZEPAM 1 MG PO TABS
2.0000 mg | ORAL_TABLET | Freq: Once | ORAL | Status: AC
  Administered 2018-11-11: 2 mg via ORAL
  Filled 2018-11-11: qty 2

## 2018-11-11 MED ORDER — LORAZEPAM 2 MG/ML IJ SOLN
2.0000 mg | Freq: Once | INTRAMUSCULAR | Status: DC
  Filled 2018-11-11: qty 1

## 2018-11-11 MED ORDER — OLANZAPINE 5 MG PO TBDP
5.0000 mg | ORAL_TABLET | Freq: Once | ORAL | Status: AC
  Administered 2018-11-11: 5 mg via ORAL
  Filled 2018-11-11: qty 1

## 2018-11-11 MED ORDER — OLANZAPINE 5 MG PO TABS
10.0000 mg | ORAL_TABLET | Freq: Once | ORAL | Status: AC
  Administered 2018-11-11: 10 mg via ORAL
  Filled 2018-11-11: qty 2

## 2018-11-11 MED ORDER — OLANZAPINE 5 MG PO TABS: 10.0000 mg | ORAL_TABLET | Freq: Every day | ORAL | Status: DC

## 2018-11-11 MED ORDER — ACETAMINOPHEN 325 MG PO TABS
650.0000 mg | ORAL_TABLET | ORAL | Status: DC | PRN
  Administered 2018-11-11: 325 mg via ORAL
  Administered 2018-11-14: 650 mg via ORAL
  Filled 2018-11-11 (×2): qty 2

## 2018-11-11 NOTE — ED Notes (Signed)
Pt's father has now left. Stated pt appeared to be becoming elevated and father stated it was best for him to leave. After his father left, pt stated "He just doesn't get it" - talked w/Sitter and Security then returned to his room.
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
## 2018-11-11 NOTE — ED Notes (Signed)
Breakfast Trays ordered  

## 2018-11-11 NOTE — ED Notes (Signed)
Pt's father visiting w/pt.

## 2018-11-11 NOTE — ED Notes (Signed)
Patient standing in hallway with threaten stance and conversation to [employer]; Patient redirected into room and EDP given new med order-Monique,RN

## 2018-11-11 NOTE — ED Notes (Signed)
Pt brushing teeth

## 2018-11-11 NOTE — Progress Notes (Signed)
CSW contacted [facility] regarding bed availability. CSW was informed that there was no bed availability and that there are already three people on the wait list. Further informed that due to this there are no facility to facility bed transfers.   Vilma Meckel. Algis Greenhouse, MSW, LCSW Clinical Social Work/Disposition Phone: [phone] Fax: [phone]

## 2018-11-11 NOTE — ED Notes (Signed)
Pt asked when he could leave.  Notified  That he was IVC'd.  Pt became hostile and told RN to "Get out  Of here, I'm not talking to you!  Fuck off!  I am voluntary!" Security at bedside.  Pt agreed to take oral pills, but stated, "If you give me a needle I'm going to sue you!".

## 2018-11-11 NOTE — ED Notes (Signed)
Pt has new sitter.  Pt began asking sitter when he was leaving and why [facility] hadn't spoken with him.  Notified that he had in fact spoken with them today.

## 2018-11-11 NOTE — Progress Notes (Signed)
Patient is seen by me via tele-psych.  Patient is very irritable during the interview.  Patient does not appear that he wants to answer questions and raises his voice a few times.  Patient states that he has been sleeping too much and takes all of his of his medications as prescribed encouraged him with him.  He states that his mother and father live on him and that he just got out of the hospital and that he is doing just fine.  Contacted patient's father for collateral, Ryanjoseph Wrice, he reports that the patient has not reported any suicidal homicidal ideations but he is always irritable and has been arguing with his wife.  He states he got out of the [facility] on Tuesday and that he has not been sleeping since he has been out and has been roaming the streets and roaming through the house.  He also reports that yesterday he randomly became agitated took all his clothes off and walked into the street naked and barefooted.  He states that the patient can become very agitated and aggressive and feels that the patient is worsening based off his appearance.  He states that when the patient gets this way he can become a threat to himself and to others and that with his bizarre behavior he is worried that this will only continue to worsen.  Notified TTS disposition about the possibility of the patient returning to the [facility] and they will be in contact with them to seek placement.

## 2018-11-11 NOTE — ED Notes (Signed)
Dahlia Client, [place], aware pt refusing to remain in room - threatening staff.

## 2018-11-11 NOTE — ED Notes (Signed)
Patient states "I don't need it right now, whenever my representation get here I will do whatever you want"-Monique,RN

## 2018-11-11 NOTE — ED Notes (Signed)
PT becoming more and more agitated.  Attempting to leave room.  MD notified.

## 2018-11-11 NOTE — ED Notes (Signed)
Patient took PO pills voluntarily without incident-Monique,RN

## 2018-11-11 NOTE — ED Provider Notes (Signed)
No acute events overnight.  Patient resting comfortably during rounding. He has no complaints.  Home depokote was not ordered - have reordered home meds.     Ward, Chase Picket, PA-C 11/11/18 0920    Maia Plan, MD 11/11/18 1919

## 2018-11-11 NOTE — ED Notes (Signed)
Pt ambulated to nurses' desk - talking to staff - noted to be delirious - states "I came in the back of a police car for fun and I need to get back to my [place]".

## 2018-11-11 NOTE — ED Notes (Signed)
Patient refused IM medication and request PO; EDP notified and gave ok to change order to [facility]

## 2018-11-11 NOTE — Progress Notes (Signed)
Patient meets criteria for inpatient treatment. No appropriate or available beds at [facility]. CSW faxed referrals to the following facilities for review:  [facility]  [facility]  [facility]  [facility]  [facility]  [facility]  [facility]  [facility]  [facility]  [facility]  [facility]  [facility]  [facility]  [facility]  [facility]  [facility]  [facility]  [facility]  [facility]  [facility]  [facility]  [facility]  [facility]  [facility]  [facility]  [facility]   TTS will continue to seek bed placement.  Vilma Meckel. Algis Greenhouse, MSW, LCSW Clinical Social Work/Disposition Phone: [phone] Fax: [phone]

## 2018-11-11 NOTE — ED Notes (Signed)
Pt arrived to Rm 52 from 54 - ambulatory - wearing burgundy scrubs. Sitter w/pt. Pt states "I'm just waiting on my x-wife and dad to get here". Pt voiced understanding of Medical Clearance Policy.

## 2018-11-12 LAB — VALPROIC ACID LEVEL: Valproic Acid Lvl: 25 ug/mL — ABNORMAL LOW (ref 50.0–100.0)

## 2018-11-12 MED ORDER — HALOPERIDOL 5 MG PO TABS
5.0000 mg | ORAL_TABLET | Freq: Four times a day (QID) | ORAL | Status: DC | PRN
  Administered 2018-11-12 – 2018-11-14 (×3): 5 mg via ORAL
  Filled 2018-11-12 (×5): qty 1

## 2018-11-12 MED ORDER — OLANZAPINE 5 MG PO TABS
5.0000 mg | ORAL_TABLET | Freq: Two times a day (BID) | ORAL | Status: DC
  Administered 2018-11-12 – 2018-11-15 (×6): 5 mg via ORAL
  Filled 2018-11-12 (×7): qty 1

## 2018-11-12 MED ORDER — DIPHENHYDRAMINE HCL 25 MG PO CAPS
50.0000 mg | ORAL_CAPSULE | Freq: Four times a day (QID) | ORAL | Status: DC | PRN
  Administered 2018-11-12 – 2018-11-14 (×5): 50 mg via ORAL
  Filled 2018-11-12 (×5): qty 2

## 2018-11-12 MED ORDER — HALOPERIDOL LACTATE 5 MG/ML IJ SOLN
5.0000 mg | Freq: Four times a day (QID) | INTRAMUSCULAR | Status: DC | PRN
  Administered 2018-11-13: 5 mg via INTRAMUSCULAR
  Filled 2018-11-12: qty 1

## 2018-11-12 MED ORDER — LORAZEPAM 2 MG/ML IJ SOLN
2.0000 mg | Freq: Four times a day (QID) | INTRAMUSCULAR | Status: DC | PRN
  Administered 2018-11-13: 2 mg via INTRAMUSCULAR
  Filled 2018-11-12: qty 1

## 2018-11-12 MED ORDER — DIPHENHYDRAMINE HCL 50 MG/ML IJ SOLN: 50.0000 mg | Freq: Four times a day (QID) | INTRAMUSCULAR | Status: DC | PRN

## 2018-11-12 MED ORDER — LORAZEPAM 1 MG PO TABS
2.0000 mg | ORAL_TABLET | Freq: Four times a day (QID) | ORAL | Status: DC | PRN
  Administered 2018-11-12 – 2018-11-14 (×5): 2 mg via ORAL
  Filled 2018-11-12 (×5): qty 2

## 2018-11-12 MED ORDER — DIVALPROEX SODIUM ER 500 MG PO TB24
1000.0000 mg | ORAL_TABLET | Freq: Every day | ORAL | Status: DC
  Administered 2018-11-13 – 2018-11-14 (×2): 1000 mg via ORAL
  Filled 2018-11-12 (×3): qty 2

## 2018-11-12 NOTE — ED Notes (Addendum)
Pt at nurses' desk - noted to be delusional. Stating he wants to call his x-wife. Pt voiced understanding he placed his 2 phone calls this AM after much encouragement to save 2nd phone call. Pt talking w/Security - threatening to hurt staff. Pt stating "I am blacklisted by the government!"

## 2018-11-12 NOTE — ED Notes (Addendum)
Pt initially refusing to take po prn meds. Offered injection then pt agreed to take po meds. Pt continuously approaching nurses' desk, w/delusional speech. Pt refuses to remain in room.
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
## 2018-11-12 NOTE — ED Notes (Signed)
Pt's father visited w/pt. Pt noted to become agitated. Pt noted to be talking loudly when RN advised pt he may not leave w/his father. Pt returned to room after talking w/RN at desk and slammed door shut. Security assisted w/opening pt's door and de-escalating pt. Father advised [facility] SW had told him to bring pt to nearest ED if he began to display agitation/delusional symptoms and they would accept pt back. He is aware [facility] has advised they are unable to accept pt's at this time. Father advised he will contact [facility] SW tomorrow. Pt then laid down on bed when father left.

## 2018-11-12 NOTE — ED Notes (Addendum)
Pt stated "I'll take that Depakote now because I'm getting ready to go to sleep". Pt then noted to be yelling "I hate it here". Pt aware is ordered to be administered at 2200.

## 2018-11-12 NOTE — ED Notes (Signed)
Pt ambulated to bathroom then to nurses' desk x 2 - pt asking if RN can call his father to tell him that he and his mother have to come together as one then states "No, don't call him. Call my wife". Pt gave Tamika's name and stated "Do not tell her my full name. She has no rights to that". [phone]. Pt continues w/delusional thinking and speaking. Pt asking to use phone - advised pt he had placed his phone calls. Pt voiced understanding and asked if he could place phone on lower portion of desk "so I don't see it". Pt noted to be pacing in hallway - back and forth to bathroom - stating "I don't need to be here". Pt encouraged to remain in his room.

## 2018-11-12 NOTE — ED Notes (Signed)
Pt noted to be irritated/agitated. States "I'm not staying here another day. You'd better get the needles ready". Security standing by. Pt then ambulated to bathroom and stated "I'm staying in here. Don't worry about me".

## 2018-11-12 NOTE — ED Notes (Signed)
Pt slept briefly. Pt now awake - ambulated to bathroom then to nurses' desk talking w/[employer] Officer. Pt continues w/delusions. Pt states "I smoke weed and it's legal". Pt staring at wall - states "Let me move, Tamika". Pt stating his dad and x-wife have failed him so now he is not able to do his taxes. Encouraged pt to return to room.

## 2018-11-12 NOTE — ED Notes (Signed)
Pt stated he was upset w/staff d/t "You did not do as I told you. You did not get my dad and x-wife here like I told you to do". Also, stated he is "going to go off if I don't get out of here and smoke my cigarette! I am not addicted so I don't need a Nicotine patch. I need the tobacco". Security and RN encouraged pt to display appropriate behavior. Pt then apologized for behavior.

## 2018-11-12 NOTE — Progress Notes (Signed)
I was contacted earlier today to review patient's chart and medications due to his agitation and reported homicidal ideations.  Started patient on Zyprexa 5 mg p.o. twice daily and also started patient on agitation protocol for Haldol p.o. or IM 5 mg every 6 hours as needed, Ativan 2 mg p.o. or IM every 6 hours as needed, and diphenhydramine 50 mg p.o. or IM every 6 hours as needed.

## 2018-11-12 NOTE — ED Notes (Signed)
Marvin Morgan, Consulting civil engineer, Doctor, general practice of no Comptroller available. Being monitored by staff.

## 2018-11-12 NOTE — ED Notes (Signed)
Pt ambulated to bathroom then back to room - pt stated "I am going to punch my dad right square in the nose".

## 2018-11-12 NOTE — ED Notes (Signed)
Pt eating dinner - apologized for his behavior earlier. Pt continues to be delusional.

## 2018-11-12 NOTE — BH Assessment (Signed)
TTS Assessment: Patient is alert and sitting upright in a chair, dressed in scrubs. Patient reports that his "HIPPA has been violated" since he told his ex wife what his medications were. Patient's mood is labile. His moments of calm/ clarity and then becomes easily agitated or tearful. He denies SI. When asked about HI he states "Oh yea! I either want to fuck someone or I want to fight someone." Patient identifies his brother as the target of his anger as they are "caught up in litigation." In regards to AVH he states "I hear and see things only if they are turned on." Per RN notes, patient has been agitated and aggressive, making threats to harm ER staff. Patient is paranoid, asking his sitter to come into the room as his witness. Patients states "If you let me out of here today I promise I won't give you any problems." This clinician explained that due to his threatening behavior in the ED it was hard for [place] to feel comfortable with discharging him. At the end of assessment patient begins to ramble about satellites and cigarettes in his truck. This clinician encouraged patient to be as cooperative as possible as it will only help him. Patient began to cry. Demonstrated understanding. In patient continues to be recommended.

## 2018-11-12 NOTE — ED Notes (Signed)
Pt requesting for someone to listen to Re-TTS - Ophelia Charter has arrived and is w/pt.

## 2018-11-12 NOTE — ED Notes (Signed)
Pt standing in hallway talking w/Security. Pt encouraged to return to room. Pt ate 2nd breakfast tray ordered d/t refused to eat first d/t had bacon on it. Pt continues to be delusional, talkative, restless, and agitated.

## 2018-11-12 NOTE — Progress Notes (Signed)
CSW contacted referral facilities with the following results:  Still reviewing: [facility] (at capacity but hold referrals for review) [facility] (no bed today but maybe tomorrow) [facility] (at capacity but hold referrals for review) [facility] (at capacity but told to call tomorrow) [facility] (no male beds today but maybe tomorrow) [facility]  Declined: [facility] (at capacity) [facility] (at capacity) [facility] (at capacity) [facility] (at capacity) [facility] (no beds) [facility] (no males until April)  TTS will continue to seek placement.  Vilma Meckel. Algis Greenhouse, MSW, LCSW Clinical Social Work/Disposition Phone: [phone] Fax: [phone]

## 2018-11-12 NOTE — ED Notes (Addendum)
Pt attempted to hug male patient who approached him in hallway. Pt returned to room as instructed by staff.

## 2018-11-12 NOTE — Progress Notes (Signed)
CSW was informed that [facility] does not have any bed availability today but may have some open tomorrow on 11/13/2018.  Shandria Clinch R. Porschea Borys, MSW, LCSW Clinical Social Work/Disposition Phone: [phone] Fax: [phone]  

## 2018-11-12 NOTE — ED Notes (Signed)
Patient received lunch tray 

## 2018-11-12 NOTE — ED Notes (Signed)
Pt refuses to stay in room - pt noted to be talking repeatedly and continuously - appears manic and delusional.

## 2018-11-12 NOTE — ED Provider Notes (Signed)
Patient seen again this morning on psychiatric rounds.  He has no complaints.  Vital signs wdl this morning.  Awaiting inpatient psychiatric placement.   Marlicia Sroka, Chase Picket, PA-C 11/12/18 4496    Shaune Pollack, MD 11/15/18 [phone]

## 2018-11-12 NOTE — ED Notes (Signed)
Pt ambulated to nurses' desk - appears calmer. Sandwich and Sprite given as requested.

## 2018-11-12 NOTE — ED Notes (Signed)
Dinner tray delivered. Pt noted to be sleeping.

## 2018-11-13 LAB — CBG MONITORING, ED: Glucose-Capillary: 101 mg/dL — ABNORMAL HIGH (ref 70–99)

## 2018-11-13 NOTE — ED Notes (Signed)
Sitter emphasized to this tech that pt requests to be able to make his allowed phone calls on this day Monday the 9th of March 2020.

## 2018-11-13 NOTE — ED Notes (Signed)
Family visiting with pt upon being wanded and placing a cell phone and set of keys in locker#1 and key handed to father

## 2018-11-13 NOTE — ED Notes (Signed)
Pt requesting Depakote at this time.

## 2018-11-13 NOTE — ED Notes (Signed)
Dinner tray ordered for pt

## 2018-11-13 NOTE — ED Notes (Signed)
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

## 2018-11-13 NOTE — ED Notes (Addendum)
Pt stated his blood sugar was low and he wanted food.  [facility] 101, food and snack times reinforced, with next meal being breakfast before 8:15 a.m.

## 2018-11-13 NOTE — ED Notes (Signed)
Wes, RN placed breakfast-tray order. 

## 2018-11-13 NOTE — Progress Notes (Signed)
Disposition CSW was contacted by pt's [facility] outpatient care coordinator, Tomma Rakers, [phone] Marvin Morgan [phone]) who relates that she has spoken to patient's father who agrees to petition for guardianship in [place].  Care Coordinator is also seeking a payee for patient.  Marvin Morgan is requesting that she be contacted when patient is placed.  Timmothy Euler. Kaylyn Lim, MSW, LCSWA Disposition Clinical Social Work [phone] (cell) [phone] (office)

## 2018-11-13 NOTE — ED Notes (Signed)
Pt was in the hallway yelling asking staff to call the [facility] crisis hot line; pt states cone only has 1 day to get him to [place] not [place]; Pt demanded his medication but only in PO form; Security called to unit for medication administration support; pt took meds without incident-Monique,RN

## 2018-11-13 NOTE — BHH Counselor (Signed)
Pt reports he is not sleeping well and is eating fine. Pt became agitated with this clinician's questions. Pt denies SI/HI/AHAVH. Pt was very tangential and aggressive his speech. Pt appears to be paranoid. Pt paced, walked and stood during reassessment. Pt continues to meet inpatient criteria.

## 2018-11-13 NOTE — ED Notes (Addendum)
Pt agitated and requested Benadryl and it was given at 5:30.  Pt continued to get aggressive and verbally abusive.  The pt then began pacing back and forth in hall refusing to stay in his room, continuing to escalate.  Security called and pt then returned to the room, demanding to go to the bathroom, qwqhich he had made about 4 trips in the two minutes preceeding.  Pt stood at doorway continuing to exhibit manipulative behaviors and attempting to aggrivate staff nurse with threats of harm and suing this nurse.  Due to increased aggitation and the need for security's pressence to ensure environmental safety to patient and staff, PRN haldol 5 mg and Ativan 2 mg were given IM without patient having to be restrained.

## 2018-11-14 MED ORDER — ARIPIPRAZOLE 5 MG PO TABS
5.0000 mg | ORAL_TABLET | Freq: Every day | ORAL | Status: DC
  Administered 2018-11-14: 5 mg via ORAL
  Filled 2018-11-14 (×2): qty 1

## 2018-11-14 MED ORDER — ZIPRASIDONE MESYLATE 20 MG IM SOLR
20.0000 mg | Freq: Once | INTRAMUSCULAR | Status: DC
  Filled 2018-11-14: qty 20

## 2018-11-14 NOTE — Progress Notes (Addendum)
CS[facility] has made several attempts [facility] but [facility] been [facility]. Referral information has been refaxed to the following facilities: [facility]  [facility]  [facility]  [facility]  [facility]  [facility]  [facility]  [facility]  [facility]  [facility]  [facility]   Disposition will also send referral application to both the [place] when EDP signed forms are received from [facility] ED.  Wells Guiles, LCSW, LCAS Disposition CSW [facility]/TTS [phone]

## 2018-11-14 NOTE — BHH Counselor (Signed)
  Reassessment- Pt was alert and oriented X 3.  Pt was observed sitting on the bed. In the middle of the assessment the pt stated "There is a bug on the floor, I don't like bugs but I don't want to kill the bug"  Pt states "I'm highly highly agitated. 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  Pt states "You have to get the wording right!  Words mean everything!"  Pt states "I have hallucinations when I hear things in my sleep"  Pt denies A/V-hallucinations. Pt states "The fact that I came in here IVC and I do not wish to have a panic attack but I keep getting these shots bother me.  Who do I need to talk to about getting me out of here?  May I speak to someone to get me out of here."     [facility] requested a BH provider to speak to pt.  BH provider, Assunta Found, NP assessed pt as request.  Pt continues to meet inpatient criteria.

## 2018-11-14 NOTE — ED Notes (Signed)
TSS reassessed

## 2018-11-14 NOTE — ED Notes (Addendum)
Father has left. He reports to nurse that he is not appropriate to come home due to his fear he would hurt himself or someone else. He reports that his behaviors are "mild" in comparison to how much they can escalate. He reports that his thoughts are changing rapidly and his aggression escalates with the subject matter. Pt now resting

## 2018-11-14 NOTE — ED Notes (Signed)
Pt coming out of his room pacing up and down hallway.  Demanding to talk to tele psych again, st's now he is homicidal.  Also demanding Depakote 1500mg .  Sitter and this RN ask pt to go back in his room.  Security called.  Pt calmed down and returned to his room.  No other problems at this time

## 2018-11-14 NOTE — ED Notes (Addendum)
Pt refused haldol- on phone with father stating that he needs note to "get out of this public hospital". He has now started to become agitated. Verbally redirected. He is having delusions of grandeur stating that he makes more than anyone here and committed suicide for 30 years and is a Curator called to standby

## 2018-11-14 NOTE — Progress Notes (Signed)
CSW consulted as pt is seeking to speak with CSW. CSW and RNCM spoke it with pt at bedside where pt asked that pt's door be closed as he "doesn't want people in his business". CSW and RNCM spoke with pt about IVC status as well as placement. CSW advised pt that with this being in place pt is unable to leave but offered to call [facility] to see where they were with placing pt. . CSW left voicemail for Carney Bern with [facility] regarding pt and plan of care for him. CSW updated pt and pt became upset stating "if your name aint Zack Seal or Montel Clock, I don't wanna talk with you". CSW expressed to pt that CSW asked that Carney Bern from [facility] call pt's RN back therefore pt would get an update once phone call had been returned.   Pt expressed "if im not out of here by tomorrow, I will be knocking down  the walls down and that is a verbal threat". Pt also stated "im going to punch hole in the wall! There will be some holes in the wall". CSW updated RN and PA of these threats to the unit.     Claude Manges Wayman Hoard, MSW, LCSW-A Emergency Department Clinical Social Worker [phone]

## 2018-11-14 NOTE — ED Provider Notes (Signed)
33 year old male here awaiting inpatient management.  Please see previous providers note for full H&P.  Patient is frequently agitated.  Upon my assessment he was talking with nursing staff with security around.  I discussed use of medication as needed for agitation with nursing staff at this time.  Patient will be given Geodon 20 mg IM to ensure patient and staff safety given his repeated threatening behavior.    Eyvonne Mechanic, PA-C 11/14/18 1523    Gwyneth Sprout, MD 11/17/18 [phone]

## 2018-11-14 NOTE — ED Notes (Signed)
Pt talking to security at this time-Monique,RN

## 2018-11-14 NOTE — ED Notes (Addendum)
PT complaining of migraine. Reports they normally give him shots in his neck. RN to given him meds. He reports benadryl and tylenol wont work. RN explained that he will need to try these first. He agreed.

## 2018-11-14 NOTE — ED Notes (Signed)
PT requesting visit with Child psychotherapist. RN let them know.

## 2018-11-14 NOTE — ED Notes (Addendum)
Pt states if he does not get out of her by tomorrow "he is going to knock down the walls and that is a verbal threat!" security called and at bedside to have discussion with pt that this is last warning. He needs to stay in room and try to stay calm. He reports understanding and agreed.

## 2018-11-14 NOTE — ED Notes (Addendum)
Pt demanding to speak to doctor and SW; states he cannot be here anymore and if he does not get visitors he is going to "lose it, break shit wide open" He is pacing from desk to room. Has to be verbally redirected constantly to go back to room and remain calm. Demanding RN to call family and ensure he has a Equities trader. Updated PA, Trey Paula, and SW at bedside

## 2018-11-14 NOTE — ED Notes (Addendum)
Patient is escalating after phone call to his wife; pt states [place] killed his whole family and he requested to have all night medication so he can go to sleep; Pt's night meds given; pt asked to talk to counselor; Penni Bombard from [facility] to talk to [facility]

## 2018-11-14 NOTE — ED Notes (Addendum)
Father, Visitor at bedside, reports his son is "in really bad shape". He reports this is not at all his normal self.

## 2018-11-14 NOTE — ED Notes (Signed)
[facility] information [facility] signed by Dr.Mesner and faxed back to [facility]

## 2018-11-14 NOTE — ED Notes (Signed)
Regular Diet was ordered for Lunch, and Patient was given a snack and drink. 

## 2018-11-15 ENCOUNTER — Encounter (HOSPITAL_COMMUNITY): Payer: Self-pay | Admitting: Registered Nurse

## 2018-11-15 NOTE — ED Notes (Signed)
Patient verbalizes understanding of discharge instructions. Opportunity for questioning and answers were provided. Armband removed by staff, pt discharged from ED ambulatory.   

## 2018-11-15 NOTE — ED Notes (Signed)
Pt taking shower.  

## 2018-11-15 NOTE — ED Notes (Signed)
Pt received lunch tray family at bedside 

## 2018-11-15 NOTE — Discharge Instructions (Addendum)
You were evaluated in the Emergency Department and after careful evaluation, we did not find any emergent condition requiring admission or further testing in the hospital. ° °Please return to the Emergency Department if you experience any worsening of your condition.  We encourage you to follow up with a primary care provider.  Thank you for allowing us to be a part of your care. °

## 2018-11-15 NOTE — Consult Note (Signed)
  Tele psych Assessment   Marvin Morgan, 33 y.o., male patient seen via tele psych by TTS and this provider; chart reviewed and consulted with Dr. Lucianne Muss on 11/15/18.  On evaluation Marvin Morgan reports that he is ready to go home.  "I came her voluntary and then it was changed to IVC.  I don't want to kill myself or anyone else.  I'm not hearing of seeing things that no ones else can; yes I'm paranoid of you.  Because I keep telling you people that I'm fine an no one is listening to me; you people are keeping me here and there is nothing wrong with me."  Patient states that he lives with his parents; that he just got out of the [employer] in May or June of 2019.  I have a problem with my short term memory; and a bunch of other medical problem that don't concern you it's military related and above your pay garde and I don't have to discuss those with you."  Patient then started I'm in [facility], I live ...stating his address; the date, his age...  I'm ready to go home.    During evaluation Marvin Morgan is sitting on side of bed; he is alert/oriented x 4; calm/cooperative; and mood congruent with affect; only getting agitated when feels that he is not going to be discharged from hospital.  Patient is speaking in a clear tone at moderate volume except for when agitation comes through; and then apologized for getting loud; normal pace; with good eye contact.  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  Patient does not appear to be responding to internal/external stimuli at this time.  Patient denies suicidal/self-harm/homicidal ideation, and psychosis.    TTS spoke with parent for collateral information  For detailed note see TTS tele assessment note  Recommendations:  Follow up with [facility] Sink for psychiatric  services.  Keep scheduled appointment.  Will need to have Valproic Acid level checked in 5 days from start ((11/17/18).    Disposition:  Patient is psychiatrically cleared  No evidence of imminent risk to self or others at present.   Patient does not meet criteria for psychiatric inpatient admission. Supportive therapy provided about ongoing stressors. Discussed crisis plan, support from social network, calling 911, coming to the Emergency Department, and calling Suicide Hotline.  Spoke with Aundra Millet, RN informed of above recommendation and disposition; states she will inform EDP  Assunta Found, NP

## 2018-11-15 NOTE — ED Notes (Signed)
Patient moved to Rm51 for safety concerns from other patient behavior in unit-Monique,RN

## 2018-11-15 NOTE — Progress Notes (Signed)
Contacted [facility] [place] and they are still on [facility] diversion per April, Clinical cytogeneticist. [phone]).  She made me aware that the [facility] said they had beds this morning on their morning call.  Timmothy Euler. Kaylyn Lim, MSW, LCSWA Disposition Clinical Social Work [phone] (cell) [phone] (office)

## 2018-11-15 NOTE — ED Provider Notes (Signed)
Patient is calm and with no evidence of continued psychosis or mania on my assessment, has been cleared by TTS, is medically cleared, and for me is not a harm to self or others, contracts for safety and will return if he needs more urgent care.     Sabas Sous, MD 11/15/18 1359

## 2018-11-15 NOTE — ED Notes (Signed)
Patient making phone call, calm and cooperative at this time.

## 2018-11-15 NOTE — ED Notes (Signed)
Patient making second phone call, requesting family to come visit him so he "can be released from the hospital".

## 2018-11-15 NOTE — ED Notes (Signed)
Lunch tray ordered 

## 2018-11-15 NOTE — ED Notes (Signed)
EDP notified of need to rescind IVC to pt may be discharged

## 2018-11-15 NOTE — ED Notes (Signed)
Patient states to this RN that if he is not released today, that he will "start acting just like homegirl down the way". Patient referring to the patient that is screaming and is verbally and physically violent towards staff and has run out of the unit. This RN requested that the patient please not do that and to continue to behave and remain cooperative.

## 2018-11-15 NOTE — [facility] Counselor (Signed)
TTS re-assessment. Patient was seen by this clinician along with Assunta Found, NP.  Patient is alert and oriented x 4. He is sitting upright in scrubs. He states "I am agitated because I am here against my will. I came I voluntarily and then it became involuntarily. I feel like I'm being cornered, that makes me agitated." Further, patient reports "I have appointments with my doctors at the [place] I am missing being in here. This institution does not help me." Patient's thoughts are disorganized and his speech is loud due to agitation. However, patient's presentation on this date is calm by comparison to previous days and he is able to maintain appropriate conversation. He currently denies SI/HI/AVH. Patient reports he would never harm himself because of his children. Patient continues to make bizarre comments such as "The colors on your shirt don't make sense because it should be a rainbow." However, patient does not appear to be a danger to self or others.  This clinician contacted patient's mother, Olivander Langham. This clinician informed her that patient is denying SI/HI/AVH and appears to be improving as he has been stabilized on medications. She reports she is going out of town and is worried that her husband will not be able to mange patient on her own. She confirmed patient has a psychiatry appointment at the [place] on 3/18. She requested we speak with her husband, who is currently at a doctor's appointment to discuss his comfort level with patient being discharged.   Patient is psych cleared pending collateral information from patient's father.

## 2018-11-15 NOTE — ED Notes (Signed)
Pt finished shower returns to room

## 2018-11-15 NOTE — ED Notes (Signed)
Pt having TTS consult 

## 2018-11-15 NOTE — ED Notes (Signed)
RN re faxed [facility] information transfer to [facility]

## 2018-12-02 ENCOUNTER — Encounter (HOSPITAL_COMMUNITY): Payer: Self-pay | Admitting: Emergency Medicine

## 2018-12-02 ENCOUNTER — Emergency Department (HOSPITAL_COMMUNITY)
Admission: EM | Admit: 2018-12-02 | Discharge: 2018-12-06 | Disposition: A | Discharge status: Other Acute Inpatient Hospital | Payer: No Typology Code available for payment source | Attending: Emergency Medicine | Admitting: Emergency Medicine

## 2018-12-02 DIAGNOSIS — Z79899 Other long term (current) drug therapy: Secondary | ICD-10-CM | POA: Diagnosis not present

## 2018-12-02 DIAGNOSIS — R45851 Suicidal ideations: Secondary | ICD-10-CM | POA: Insufficient documentation

## 2018-12-02 DIAGNOSIS — R440 Auditory hallucinations: Secondary | ICD-10-CM | POA: Diagnosis present

## 2018-12-02 DIAGNOSIS — F32A Depression, unspecified: Secondary | ICD-10-CM

## 2018-12-02 DIAGNOSIS — F312 Bipolar disorder, current episode manic severe with psychotic features: Secondary | ICD-10-CM | POA: Insufficient documentation

## 2018-12-02 DIAGNOSIS — F329 Major depressive disorder, single episode, unspecified: Secondary | ICD-10-CM

## 2018-12-02 LAB — COMPREHENSIVE METABOLIC PANEL
ALT: 38 U/L (ref 0–44)
AST: 49 U/L — ABNORMAL HIGH (ref 15–41)
Albumin: 3.9 g/dL (ref 3.5–5.0)
Alkaline Phosphatase: 47 U/L (ref 38–126)
Anion gap: 10 (ref 5–15)
BILIRUBIN TOTAL: 0.6 mg/dL (ref 0.3–1.2)
BUN: 14 mg/dL (ref 6–20)
CO2: 21 mmol/L — ABNORMAL LOW (ref 22–32)
Calcium: 9.3 mg/dL (ref 8.9–10.3)
Chloride: 106 mmol/L (ref 98–111)
Creatinine, Ser: 1.08 mg/dL (ref 0.61–1.24)
GFR calc Af Amer: >60 mL/min (ref >60)
GFR calc non Af Amer: >60 mL/min (ref >60)
Glucose, Bld: 102 mg/dL — ABNORMAL HIGH (ref 70–99)
Potassium: 3.4 mmol/L — ABNORMAL LOW (ref 3.5–5.1)
Sodium: 137 mmol/L (ref 135–145)
TOTAL PROTEIN: 6.7 g/dL (ref 6.5–8.1)

## 2018-12-02 LAB — CBC WITH DIFFERENTIAL/PLATELET
Abs Immature Granulocytes: 0 10*3/uL (ref 0.00–0.07)
BASOS PCT: 1 %
Basophils Absolute: 0.1 10*3/uL (ref 0.0–0.1)
EOS ABS: 0.3 10*3/uL (ref 0.0–0.5)
Eosinophils Relative: 6 %
HCT: 45.7 % (ref 39.0–52.0)
Hemoglobin: 14.9 g/dL (ref 13.0–17.0)
Immature Granulocytes: 0 %
Lymphocytes Relative: 42 %
Lymphs Abs: 2.2 10*3/uL (ref 0.7–4.0)
MCH: 29.1 pg (ref 26.0–34.0)
MCHC: 32.6 g/dL (ref 30.0–36.0)
MCV: 89.3 fL (ref 80.0–100.0)
Monocytes Absolute: 0.3 10*3/uL (ref 0.1–1.0)
Monocytes Relative: 6 %
NRBC: 0 % (ref 0.0–0.2)
Neutro Abs: 2.4 10*3/uL (ref 1.7–7.7)
Neutrophils Relative %: 45 %
Platelets: 231 10*3/uL (ref 150–400)
RBC: 5.12 MIL/uL (ref 4.22–5.81)
RDW: 12.7 % (ref 11.5–15.5)
WBC: 5.3 10*3/uL (ref 4.0–10.5)

## 2018-12-02 LAB — RAPID URINE DRUG SCREEN, HOSP PERFORMED
Amphetamines: NOT DETECTED
Barbiturates: NOT DETECTED
Benzodiazepines: NOT DETECTED
Cocaine: NOT DETECTED
Opiates: NOT DETECTED
TETRAHYDROCANNABINOL: POSITIVE — AB

## 2018-12-02 LAB — ETHANOL: Alcohol, Ethyl (B): 14 mg/dL — ABNORMAL HIGH (ref <10)

## 2018-12-02 LAB — SALICYLATE LEVEL: Salicylate Lvl: <7 mg/dL (ref 2.8–30.0)

## 2018-12-02 LAB — ACETAMINOPHEN LEVEL: Acetaminophen (Tylenol), Serum: <10 ug/mL — ABNORMAL LOW (ref 10–30)

## 2018-12-02 NOTE — ED Triage Notes (Signed)
Pt reports he was driving when he almost had a panic attack, reports he has a hx of depression and had thoughts of harming himself earlier today but denies current suicidal or homicidal ideation.

## 2018-12-02 NOTE — ED Provider Notes (Signed)
33 year old male received a signout from Dr. Rush Landmark pending medical clearance and TTS consult.  Per his note:  "The history is provided by the patient and medical records. No language interpreter was used.  Depression  Pertinent negatives include no chest pain, no abdominal pain, no headaches and no shortness of breath.  Mental Health Problem  Presenting symptoms: depression, hallucinations and suicidal thoughts   Presenting symptoms: no aggressive behavior, no agitation, no delusions, no paranoid behavior, no suicidal threats and no suicide attempt   Degree of incapacity (severity):  Moderate Onset quality:  Gradual Duration:  2 weeks Timing:  Intermittent Progression:  Waxing and waning Chronicity:  Recurrent Treatment compliance:  All of the time Relieved by:  Nothing Worsened by:  Nothing Associated symptoms: no abdominal pain, no chest pain and no headaches   Risk factors: hx of mental illness and hx of suicide attempts  Marvin Morgan is a 33 y.o. male with a past medical history significant for bipolar disorder and PTSD who presents with suicidal ideation and auditory hallucination recurrence.  He reports that he was admitted for several days to several weeks ago and he reports that since that time he has had worsening depression.  He reports that he is living both at home with his parents as well as in his truck.  He says that he hears voices talking to him through the television which is always on at home.  He says that he had worsening depression today and had thoughts of hurting himself.  His plan is to slit his wrists with a knife.  He says that it has been coming and going all day today and he is concerned he may hurt himself.  He denies homicidal ideation.  He denies taking any new medications supplements or drugs to cope with his depression.  He reports that he is tried to kill himself "every year for 30 years".  Patient is 33 years old.  He denies physical complaints at this  time and denies any fevers, chills, chest, cough, shortness of breath, nausea vomiting, urinary symptoms or GI symptoms."    During my interview the patient states, "things are getting more stressful at home." He does not elaborate on this statement.   Physical Exam  BP 115/68 (BP Location: Right Arm)   Pulse 91   Temp 98 F (36.7 C) (Oral)   Resp 19   SpO2 98%   Physical Exam Vitals signs and nursing note reviewed.  Constitutional:      Appearance: He is well-developed.  HENT:     Head: Normocephalic.  Eyes:     Conjunctiva/sclera: Conjunctivae normal.  Neck:     Musculoskeletal: Neck supple.  Cardiovascular:     Rate and Rhythm: Normal rate and regular rhythm.     Heart sounds: No murmur.  Pulmonary:     Effort: Pulmonary effort is normal.  Abdominal:     General: There is no distension.     Palpations: Abdomen is soft.  Skin:    General: Skin is warm and dry.  Neurological:     Mental Status: He is alert.  Psychiatric:        Speech: Speech is rapid and pressured.        Behavior: Behavior is agitated.        Thought Content: Thought content includes suicidal ideation. Thought content does not include homicidal ideation. Thought content includes suicidal plan.     ED Course/Procedures   Clinical Course as of Dec 02 2669  Sun Dec 03, 2018  0007 Patient recheck.  Patient is asking to leave.  He states feeling better and I would like some fresh air.  The patient continues to come out of the room and stand in the hallway.  He states "I'm claustrophobic. You can't make me stay in this small room.  Discussed with the patient that staying in his room is for his safety and to help protect the privacy of other. He states "I'm a veteran, and I don't go to public hospitals. If you try to keep me you're going to have to send a police office over the the spot you're standing. IVC paperwork has been signed by Dr. Elesa Massed and has been sent to the magistrate.    [MM]  0020 Called back  into the patient's room.  He is becoming increasingly agitated.  He states " I am not staying here unless you commit me."  Discussed with the patient for a second time that IVC paperwork had been completed. He states "I'm not talking to you again, nurse." Re-educated the patient on my role on the healthcare team.  He continues to refuse to change out of his clothes into paper scrubs.   [MM]    Clinical Course User Index [MM] Grettell Ransdell A, PA-C    Procedures  MDM  33 year old male with a history of bipolar disorder PTSD received at signout from Dr. Rush Landmark pending medical clearance and TTS consult.  Labs with ethanol level of 14.  UDS is positive for THC.  AST is elevated at 49, which appears chronic.  Labs are otherwise reassuring.  The patient is medically cleared at this time.  TTS was consulted and recommended inpatient treatment.  Upon reevaluation of the patient, he is requesting to leave.  IVC paperwork was completed by Dr. Elesa Massed, attending physician.  Psych hold orders and home med orders placed. Please see psych team notes for further documentation of care/dispo. Pt stable at time of med clearance.         Frederik Pear A, PA-C 12/03/18 0135    Ward, Layla Maw, DO 12/03/18 0157

## 2018-12-02 NOTE — ED Notes (Signed)
TTS completed. 

## 2018-12-02 NOTE — ED Notes (Signed)
Pt refusing to be placed in scrubs. Provider made aware.

## 2018-12-02 NOTE — ED Provider Notes (Addendum)
[facility] EMERGENCY DEPARTMENT Provider Note   CSN: [id number] Arrival date & time: 12/02/18  2234    History   Chief Complaint No chief complaint on file. SI   HPI Marvin Morgan is a 33 y.o. male.     The history is provided by the patient and medical records. No language interpreter was used.  Depression  Pertinent negatives include no chest pain, no abdominal pain, no headaches and no shortness of breath.  Mental Health Problem  Presenting symptoms: depression, hallucinations and suicidal thoughts   Presenting symptoms: no aggressive behavior, no agitation, no delusions, no paranoid behavior, no suicidal threats and no suicide attempt   Degree of incapacity (severity):  Moderate Onset quality:  Gradual Duration:  2 weeks Timing:  Intermittent Progression:  Waxing and waning Chronicity:  Recurrent Treatment compliance:  All of the time Relieved by:  Nothing Worsened by:  Nothing Associated symptoms: no abdominal pain, no chest pain and no headaches   Risk factors: hx of mental illness and hx of suicide attempts     Past Medical History:  Diagnosis Date   Bipolar 1 disorder, mixed, severe (HCC)    PTSD (post-traumatic stress disorder)     Patient Active Problem List   Diagnosis Date Noted   Bipolar affective disorder, current episode manic (HCC)     History reviewed. No pertinent surgical history.      Home Medications    Prior to Admission medications   Medication Sig Start Date End Date Taking? Authorizing Provider  ARIPiprazole (ABILIFY) 5 MG tablet Take 5 mg by mouth daily.    [provider]  atorvastatin (LIPITOR) 40 MG tablet Take 40 mg by mouth at bedtime.    [provider]  divalproex (DEPAKOTE ER) 500 MG 24 hr tablet Take 1,000 mg by mouth at bedtime.     [provider]  prazosin (MINIPRESS) 2 MG capsule Take 2 mg by mouth at bedtime.    [provider]  traZODone (DESYREL) 100 MG  tablet Take 100 mg by mouth at bedtime.    [provider]  Vitamin D, Ergocalciferol, (DRISDOL) 1.25 MG (50000 UT) CAPS capsule Take 50,000 Units by mouth every 7 (seven) days.    [provider]    Family History History reviewed. No pertinent family history.  Social History Social History   Tobacco Use   Smoking status: Never Smoker   Smokeless tobacco: Never Used  Substance Use Topics   Alcohol use: Yes    Comment: socially   Drug use: Yes    Types: Marijuana     Allergies   Pork-derived products   Review of Systems Review of Systems  Constitutional: Negative for chills and diaphoresis.  HENT: Negative for congestion.   Respiratory: Negative for cough, chest tightness, shortness of breath and wheezing.   Cardiovascular: Negative for chest pain, palpitations and leg swelling.  Gastrointestinal: Negative for abdominal pain, constipation, diarrhea, nausea and rectal pain.  Genitourinary: Negative for flank pain.  Musculoskeletal: Negative for back pain.  Neurological: Negative for headaches.  Psychiatric/Behavioral: Positive for depression, hallucinations and suicidal ideas. Negative for agitation, confusion and paranoia.  All other systems reviewed and are negative.    Physical Exam Updated Vital Signs BP 115/68 (BP Location: Right Arm)    Pulse 91    Temp 98 F (36.7 C) (Oral)    Resp 19    SpO2 98%   Physical Exam Vitals signs and nursing note reviewed.  Constitutional:  General: He is not in acute distress.    Appearance: He is well-developed. He is not ill-appearing, toxic-appearing or diaphoretic.  HENT:     Head: Normocephalic and atraumatic.     Nose: No congestion or rhinorrhea.  Eyes:     Conjunctiva/sclera: Conjunctivae normal.     Pupils: Pupils are equal, round, and reactive to light.  Neck:     Musculoskeletal: Neck supple.  Cardiovascular:     Rate and Rhythm: Normal rate and regular rhythm.     Pulses: Normal  pulses.     Heart sounds: No murmur.  Pulmonary:     Effort: Pulmonary effort is normal. No respiratory distress.     Breath sounds: Normal breath sounds.  Abdominal:     Palpations: Abdomen is soft.     Tenderness: There is no abdominal tenderness.  Skin:    General: Skin is warm and dry.     Capillary Refill: Capillary refill takes less than 2 seconds.  Neurological:     General: No focal deficit present.     Mental Status: He is alert.  Psychiatric:        Attention and Perception: He perceives auditory hallucinations.        Mood and Affect: Mood is depressed.        Behavior: Behavior is not agitated or aggressive.        Thought Content: Thought content includes suicidal ideation. Thought content does not include homicidal ideation. Thought content includes suicidal plan. Thought content does not include homicidal plan.      ED Treatments / Results  Labs (all labs ordered are listed, but only abnormal results are displayed) Labs Reviewed  COMPREHENSIVE METABOLIC PANEL  ETHANOL  RAPID URINE DRUG SCREEN, HOSP PERFORMED  CBC WITH DIFFERENTIAL/PLATELET  ACETAMINOPHEN LEVEL  SALICYLATE LEVEL    EKG None  Radiology No results found.  Procedures Procedures (including critical care time)  Medications Ordered in ED Medications - No data to display   Initial Impression / Assessment and Plan / ED Course  I have reviewed the triage vital signs and the nursing notes.  Pertinent labs & imaging results that were available during my care of the patient were reviewed by me and considered in my medical decision making (see chart for details).  Clinical Course as of Dec 02 1528  Sun Dec 03, 2018  0007 Patient recheck.  Patient is asking to leave.  He states feeling better and I would like some fresh air.  The patient continues to come out of the room and stand in the hallway.  He states "I'm claustrophobic. You can't make me stay in this small room.  Discussed with the  patient that staying in his room is for his safety and to help protect the privacy of other. He states "I'm a veteran, and I don't go to public hospitals. If you try to keep me you're going to have to send a police office over the the spot you're standing. IVC paperwork has been signed by Dr. Elesa Massed and has been sent to the magistrate.    [MM]  0020 Called back into the patient's room.  He is becoming increasingly agitated.  He states " I am not staying here unless you commit me."  Discussed with the patient for a second time that IVC paperwork had been completed. He states "I'm not talking to you again, nurse." Re-educated the patient on my role on the healthcare team.  He continues to refuse to change out  of his clothes into paper scrubs.   [MM]    Clinical Course User Index [MM] McDonald, Mia A, PA-C       Marvin Morgan is a 33 y.o. male with a past medical history significant for bipolar disorder and PTSD who presents with suicidal ideation and auditory hallucination recurrence.  He reports that he was admitted for several days to several weeks ago and he reports that since that time he has had worsening depression.  He reports that he is living both at home with his parents as well as in his truck.  He says that he hears voices talking to him through the television which is always on at home.  He says that he had worsening depression today and had thoughts of hurting himself.  His plan is to slit his wrists with a knife.  He says that it has been coming and going all day today and he is concerned he may hurt himself.  He denies homicidal ideation.  He denies taking any new medications supplements or drugs to cope with his depression.  He reports that he is tried to kill himself "every year for 30 years".  Patient is 33 years old.  He denies physical complaints at this time and denies any fevers, chills, chest, cough, shortness of breath, nausea vomiting, urinary symptoms or GI symptoms.  On exam,  lungs are clear and chest is nontender.  Patient resting comfortably.  Patient is not agitated.  Patient does report intermittent suicidal ideation today with a plan to slit her wrist.  Patient will have screening laboratory testing performed and TTS will be called for management recommendations.  Anticipate follow-up on recommendations.    Patient is here voluntarily currently.  Do not feel he needs IVC as his suicidal ideation is intermittent and he reports at the moment, he is not suicidal.  Anticipate following up on TTS recommendations.     11:10 PM Care transferred to me McDonald PA-C while awaiting for results of labs for medical clearance and then TTS recommendations for psychiatric clearance.  Care transferred in stable condition while patient was resting comfortably.   Final Clinical Impressions(s) / ED Diagnoses   Final diagnoses:  Suicidal ideations  Auditory hallucination  Depression, unspecified depression type     Clinical Impression: 1. Suicidal ideations   2. Auditory hallucination   3. Depression, unspecified depression type     Disposition: Care transferred while awaiting medical and psych clearance.   This note was prepared with assistance of Conservation officer, historic buildings. Occasional wrong-word or sound-a-like substitutions may have occurred due to the inherent limitations of voice recognition software.      Aileena Iglesia, Canary Brim, MD 12/02/18 2312    Demetreus Lothamer, Canary Brim, MD 12/03/18 [phone]
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
## 2018-12-03 ENCOUNTER — Other Ambulatory Visit: Payer: Self-pay

## 2018-12-03 MED ORDER — TRAZODONE HCL 100 MG PO TABS
100.0000 mg | ORAL_TABLET | Freq: Every day | ORAL | Status: DC
  Administered 2018-12-03 – 2018-12-05 (×4): 100 mg via ORAL
  Filled 2018-12-03 (×4): qty 1

## 2018-12-03 MED ORDER — ZIPRASIDONE MESYLATE 20 MG IM SOLR: 20.0000 mg | Freq: Once | INTRAMUSCULAR | Status: DC | PRN

## 2018-12-03 MED ORDER — ZIPRASIDONE MESYLATE 20 MG IM SOLR
20.0000 mg | Freq: Once | INTRAMUSCULAR | Status: DC
  Filled 2018-12-03: qty 20

## 2018-12-03 MED ORDER — DIPHENHYDRAMINE HCL 50 MG/ML IJ SOLN: 50.0000 mg | Freq: Four times a day (QID) | INTRAMUSCULAR | Status: DC | PRN

## 2018-12-03 MED ORDER — PRAZOSIN HCL 2 MG PO CAPS
2.0000 mg | ORAL_CAPSULE | Freq: Every day | ORAL | Status: DC
  Administered 2018-12-03 (×2): 2 mg via ORAL
  Filled 2018-12-03 (×3): qty 1

## 2018-12-03 MED ORDER — LORAZEPAM 1 MG PO TABS
2.0000 mg | ORAL_TABLET | Freq: Four times a day (QID) | ORAL | Status: DC | PRN
  Administered 2018-12-04 – 2018-12-06 (×5): 2 mg via ORAL
  Filled 2018-12-03 (×7): qty 2

## 2018-12-03 MED ORDER — LORAZEPAM 1 MG PO TABS
1.0000 mg | ORAL_TABLET | Freq: Once | ORAL | Status: AC
  Administered 2018-12-03: 1 mg via ORAL
  Filled 2018-12-03: qty 1

## 2018-12-03 MED ORDER — DIPHENHYDRAMINE HCL 25 MG PO CAPS
50.0000 mg | ORAL_CAPSULE | Freq: Four times a day (QID) | ORAL | Status: DC | PRN
  Administered 2018-12-03 – 2018-12-06 (×3): 50 mg via ORAL
  Filled 2018-12-03 (×5): qty 2

## 2018-12-03 MED ORDER — ARIPIPRAZOLE 5 MG PO TABS
5.0000 mg | ORAL_TABLET | Freq: Every day | ORAL | Status: DC
  Administered 2018-12-03 – 2018-12-06 (×4): 5 mg via ORAL
  Filled 2018-12-03 (×5): qty 1

## 2018-12-03 MED ORDER — HALOPERIDOL LACTATE 5 MG/ML IJ SOLN: 5.0000 mg | Freq: Four times a day (QID) | INTRAMUSCULAR | Status: DC | PRN

## 2018-12-03 MED ORDER — IBUPROFEN 400 MG PO TABS: 600.0000 mg | ORAL_TABLET | Freq: Three times a day (TID) | ORAL | Status: DC | PRN

## 2018-12-03 MED ORDER — DIVALPROEX SODIUM ER 500 MG PO TB24
1000.0000 mg | ORAL_TABLET | Freq: Every day | ORAL | Status: DC
  Administered 2018-12-03 – 2018-12-05 (×4): 1000 mg via ORAL
  Filled 2018-12-03 (×4): qty 2

## 2018-12-03 MED ORDER — ATORVASTATIN CALCIUM 40 MG PO TABS
40.0000 mg | ORAL_TABLET | Freq: Every day | ORAL | Status: DC
  Administered 2018-12-03 – 2018-12-05 (×4): 40 mg via ORAL
  Filled 2018-12-03 (×4): qty 1

## 2018-12-03 MED ORDER — STERILE WATER FOR INJECTION IJ SOLN
INTRAMUSCULAR | Status: AC
  Filled 2018-12-03: qty 10

## 2018-12-03 MED ORDER — LORAZEPAM 2 MG/ML IJ SOLN: 2.0000 mg | Freq: Four times a day (QID) | INTRAMUSCULAR | Status: DC | PRN

## 2018-12-03 MED ORDER — ONDANSETRON HCL 4 MG PO TABS: 4.0000 mg | ORAL_TABLET | Freq: Three times a day (TID) | ORAL | Status: DC | PRN

## 2018-12-03 MED ORDER — HALOPERIDOL 5 MG PO TABS
5.0000 mg | ORAL_TABLET | Freq: Four times a day (QID) | ORAL | Status: DC | PRN
  Administered 2018-12-03 – 2018-12-06 (×5): 5 mg via ORAL
  Filled 2018-12-03 (×7): qty 1

## 2018-12-03 MED ORDER — ALUM & MAG HYDROXIDE-SIMETH 200-200-20 MG/5ML PO SUSP: 30.0000 mL | Freq: Four times a day (QID) | ORAL | Status: DC | PRN

## 2018-12-03 NOTE — Progress Notes (Addendum)
CSW contacted Earlene Plater at [facility] regarding bed availability and referral process. Earlene Plater confirmed that they do have psych beds available and stated that he would fax over referral paperwork to CSW, including paperwork for COVID-19 screening.  CSW contacted Drue Stager at [facility] regarding bed availability and referral process. Drue Stager confirmed that they also have psych beds available and stated that CSW could send over referral packet. Drue Stager also stated that patient had just left their facility on 11/30/2018.   CSW faxed over paperwork to [facility].  Austell R. Algis Greenhouse, MSW, LCSW Clinical Social Work/Disposition Phone: [phone] Fax: [phone]

## 2018-12-03 NOTE — ED Notes (Signed)
Pt ate sandwich given for snack. Pt continues to appear delusional. States "It's not right for parents to make their child come here". States "I drove myself here because where I was staying at, I was going to kill my brother and that's not a threat". States he is adopted and "I rebuke the [place] name". 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 Pt had called his parents to let them know he is here d/t states he did not tell them last night where he was going. States he has a special VA person who visits him everyday and wants them to visit him here. Advised pt of hospital policy - no visitors at this time d/t COVID-19. Pt pacing in hallway.

## 2018-12-03 NOTE — ED Notes (Addendum)
Pt ambulated in hallway, talking w/staff. Pt drank Sprite given.

## 2018-12-03 NOTE — ED Notes (Signed)
Pt is aggravated ,  security at bedside

## 2018-12-03 NOTE — Progress Notes (Signed)
Patient meets criteria for inpatient treatment. No appropriate or available beds at [facility]. CSW faxed referrals to the following facilities for review:  [facility]  [facility]  [facility]  [facility]  [facility]  [facility]  [facility]  [facility]  [facility]  [facility]  [facility]  [facility]  [facility]  [facility]  [facility]  [facility]   TTS will continue to seek bed placement.  Vilma Meckel. Algis Greenhouse, MSW, LCSW Clinical Social Work/Disposition Phone: [phone] Fax: [phone]

## 2018-12-03 NOTE — ED Notes (Signed)
Copy of IVC paperwork re-faxed to [facility] as requested. Pt aware waiting for Re-TTS. Pt states "They don't need to talk to me everyday".

## 2018-12-03 NOTE — ED Notes (Addendum)
Pt ambulatory to Rm 51 - wearing burgundy scrubs. Sitter w/pt. Pt eating breakfast. Per documentation, pt has 1 labeled belongings bag in Locker #1 and Valuables Envelope w/Security. Pt verbalized understanding of Medical Clearance Pt Policy and had signed form - copy given to pt. Pt is under IVC - completed by Dr Elesa Massed. Per documentation, copy was faxed to [facility] and original placed in folder for Magistrate. Copy sent to Medical Records and all 3 sets on clipboard.

## 2018-12-03 NOTE — ED Notes (Signed)
Ordered a regular breakfast tray--no sharps--Marvin Morgan

## 2018-12-03 NOTE — ED Notes (Signed)
Pt called his parents from phone at nurses' desk.

## 2018-12-03 NOTE — ED Notes (Signed)
Pt noted to be lying on bed - warm blankets given as requested.

## 2018-12-03 NOTE — ED Notes (Signed)
Valuables inventoried and placed in locker #1

## 2018-12-03 NOTE — BH Assessment (Signed)
Reassessment--Pt states that he does "Not feel like talking". He states that he did not sleep well. He denies SI, but states that he is feeling like harming others. When asked specifics, pt states, "That's enough, I am an angry child". Pt denies AVH.   MSE: Pt is casually dressed in scubs, slightly drowsy, oriented x4 with normal speech and normal motor behavior. Eye contact is poor. Pt's mood is depressed and affect is depressed and irritable. Affect is congruent with mood. Thought process is somewhat coherent and relevant. There is no indication that pt is currently responding to internal stimuli, but he may be experiencing delusional thought content. Pt was minimally cooperative throughout assessment.   TTS will continue to seek placement. Becky, RN states that pt was recently d/c'd from the [place] after a 3 day admission.

## 2018-12-03 NOTE — Progress Notes (Signed)
CSW was phoned by Claris Che from [facility] regarding bed status. She stated that they do not have any three way beds available but information could be sent over tomorrow.   TTS will continue to seek placement.  Vilma Meckel. Algis Greenhouse, MSW, LCSW Clinical Social Work/Disposition Phone: [phone] Fax: [phone]

## 2018-12-03 NOTE — Progress Notes (Addendum)
CSW phone by Suzan Slick regarding patient being declined due to psychiatrist at their facility feeling that he no longer meets inpatient criteria.   Disposition CSW will continue to seek bed placement.   Vilma Meckel. Algis Greenhouse, MSW, LCSW Clinical Social Work/Disposition Phone: [phone] Fax: [phone]

## 2018-12-03 NOTE — ED Notes (Signed)
Pt continues to be pacing and is talking louder - states he is angry. Asked pt if he needed medication, states "I'll take everything you've got. Just get me out here". States he is having to talk to himself. States he prefers to take a pill. SW [facility] to ask NP for med recommendation.

## 2018-12-03 NOTE — Progress Notes (Signed)
CSW contacted by [facility] from [facility] to notify regarding no bed availability today. She advised that bed status could be checked again tomorrow.   Vilma Meckel. Algis Greenhouse, MSW, LCSW Clinical Social Work/Disposition Phone: [phone] Fax: [phone]

## 2018-12-04 NOTE — ED Notes (Signed)
Pt is eating dinner tray.

## 2018-12-04 NOTE — BH Assessment (Signed)
[facility] Assessment Progress Note    TTS Reassessment:  Patient was seen this date.  He indicated that he is feeling better now and states that he does not feel like he needs to be in the hospital.  He states that he receives services at the [facility] in [place].  Patient is currently denying SI/HI/Psychosis.  Patient states that he has been angry with his family and states that he needs to move away from them. Patient states that he continues to have sleep and appetite disturbance.  TTS will have Reola Calkins to consult on patient for final disposition.  Per Reola Calkins, NP, inpatient treatment continues to be recommended.

## 2018-12-04 NOTE — BH Assessment (Addendum)
Received a call back from [facility] (AOD). The AOD reported potential bed availability. The AOD will email this counselor a client packet/application to be filled out for his consideration. Please note that all assessments, medical documentation, etc must be cosigned by a MD. Once the client packet/application is received staff will need to complete.

## 2018-12-04 NOTE — BH Assessment (Signed)
Patient continues to meet criteria for inpatient treatment. Re-faxed updated labs/clinicals to [facility], [place], [address], [place], [place], [address] [place], [place], [place], [address], [place], [address], [place], [place], [place], [place], [place], and [employer]. Also, contacted the [facility] medical center and and left a message for the transfer coordinator. The transfer coordinator will need this information if he/she calls back (last 4 digits of social is [phone]).

## 2018-12-04 NOTE — ED Notes (Signed)
Attempted to give Pt meds. Pt refused at this time. Pt is becoming verbally abusive.

## 2018-12-04 NOTE — ED Notes (Signed)
Per Dr Adela Lank may hold off on vitals while pt is asleep

## 2018-12-04 NOTE — ED Notes (Addendum)
Pt making phone call. Pt noted to be telling whoever he is talking to "get me out of here." Saying "I have private doctors, I will sue, I will tell them everything you did to me." Pacing somewhat, informing staff that "They made me take meth. They are trying to make me homeless. Hospitals help people that are sick. I'm not sick."

## 2018-12-04 NOTE — ED Notes (Signed)
Pt getting agitated asking about where he's going. RN told pt plan of care. Pt more agitated. Pt did agree to take Ativan but stated he would not take anything else.

## 2018-12-04 NOTE — ED Notes (Signed)
Pt's agitation increasing, demanding to call VA crisis line. Explained that we are working to get him to the [facility] hospital. Cooperative with taking PO meds

## 2018-12-04 NOTE — BH Assessment (Signed)
Declined at [facility] due to patient acuity. No appropriate beds.

## 2018-12-04 NOTE — ED Notes (Signed)
Pt stating he does not want anything and wants to go home. Sitter at bedside. Pt is answering questions  and is cooperative at this time.

## 2018-12-04 NOTE — ED Notes (Signed)
Pt asking to use phone to call his dad. Pt made aware he has had his allotted calls for the day. Pt states someone must call his dad to let his dad know the Pt will be going to all of his dads appointments tomorrow. Pt made aware he is IVC and awaiting placement for inpatient treatment. Pt raised his voice and said we can not do that. This nurse informed him again and pt closed his eyes.

## 2018-12-04 NOTE — ED Notes (Signed)
Pt came out requesting soda, sprite given along with cookies as it is snacktime. Asking about calling his "escort" advised that he has made his calls for the day. He stated then the escort may need to be contacted, advised no visitors are allowed at this time. He stated "it's not a visitor, it's an escort." Returned to room, resting at this time. Will continue to monitor.

## 2018-12-04 NOTE — ED Notes (Signed)
Pt awake and states he wants to call his sister. Told him he could make a call after 0700. Pt states that he's just "ready to get out of here."

## 2018-12-04 NOTE — ED Notes (Signed)
Breakfast Tray Order

## 2018-12-04 NOTE — Progress Notes (Addendum)
CSW refaxed VA application to [address], [place], as it is unclear what (if any) additional information is needed. CSW left message with transfer coordinator Vernona Rieger [phone]) requesting a call back.   Wells Guiles, LCSW, LCAS Disposition CSW [facility]/TTS [phone]

## 2018-12-04 NOTE — ED Notes (Signed)
Pt making second phone call. Advised that this is his second phone call of the day and that he can make no more. Verbalizes understanding.
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
## 2018-12-05 NOTE — ED Notes (Signed)
Pt now awake talking to himself.

## 2018-12-05 NOTE — ED Notes (Signed)
Dr Juleen China aware [facility] Marvin Morgan is inquiring about pt's BP and is requesting EDP to enter note. Pt has been eating/drinking w/o difficulty. Pt has been ambulating w/o difficulty. Pt is on Prazosin.

## 2018-12-05 NOTE — ED Notes (Addendum)
Pt noted to be agitated after making 2nd and last phone call. Pt states he is going to sue his parents and make them lose all of their land "because it belongs to the government anyway". When asked pt how he is feeling - states "that question is unnecessary. It's horrible. Everyday I'm spending in the hospital, it's horrible, so don't ask me that. It's unnecessary". Pt given Ativan and Haldol for agitation.

## 2018-12-05 NOTE — ED Notes (Signed)
Breakfast Tray Ordered. 

## 2018-12-05 NOTE — ED Notes (Signed)
When pt wakes from sleeping, asking repetitive questions - when is he going to see the dr so he can leave, he is blaming his parents for being here, he is going to sue his parents.

## 2018-12-05 NOTE — Progress Notes (Signed)
CSW followed up with [facility], April Alexander, who reviewed packet sent to them last night.  She has reviewed packet and feels that it is complete.  She will [facility] by [facility] and call back to advise if they need anything or, if they have all the referral information they need.  CSW will continue to follow for placement.  Timmothy Euler. Kaylyn Lim, MSW, LCSWA Disposition Clinical Social Work [phone] (cell) [phone] (office)

## 2018-12-05 NOTE — ED Notes (Signed)
Pt on phone at nurses' desk - asking for his parents to "get me out of here, can you do that for me?" States "I'm here because of y'all".

## 2018-12-05 NOTE — BH Assessment (Signed)
Reassessment note- Pt presents lying in hospital bed with eyes closed. He was recently given meds for worsening AH. Pt states he can't understand what the voices are saying, but it's good and evil. Pt denies SI & HI. Pt was irritable regarding having to stay in the hospital. He states he has been at this for years and years. Pt continues to meet criteria for inpt hospitalization.

## 2018-12-05 NOTE — ED Notes (Signed)
Pt is on phone with mother. This is patients second phone call this morning.

## 2018-12-05 NOTE — ED Notes (Signed)
Pt awake - ambulated to bathroom - then asking for toothbrush and tooth paste - given.

## 2018-12-05 NOTE — ED Notes (Signed)
Pt ambulated in hallway briefly and talked to himself while eating snack then returned to his room.

## 2018-12-05 NOTE — Progress Notes (Signed)
CSW received call from [facility] Transfer coordinator, April Alexander, Charity fundraiser.  [facility] admitting physician is concerned about patient's hypo-tensive BP.  They are asking what, if any interventions, are in place to stabilize patient's BP.  CSW agreed to speak to patient' ED RN.  CSW contacted Becky B, RN, who indicated that patient's BP has been low consistently since he arrived to the ED, and that he is eating and drinking adequately.  She also noted that patient takes a medication at night (Prazosin) that is likely causing his low BP.    CSW asked that the EDP be consulted so that a note could be written by a medical provider documenting the effect of the medication and indicating that the patient is medically cleared despite the hypo-tensive state.  Timmothy Euler. Kaylyn Lim, MSW, LCSWA Disposition Clinical Social Work [phone] (cell) [phone] (office)

## 2018-12-05 NOTE — ED Notes (Signed)
Pt in shower.  

## 2018-12-05 NOTE — ED Notes (Signed)
Haldol and Ativan given as requested d/t pt talking louder and louder to himself and becoming restless.

## 2018-12-05 NOTE — ED Notes (Signed)
Breakfast tray delivered to pt - Pt returned to room after calling his parents and laid down on bed.

## 2018-12-05 NOTE — ED Notes (Addendum)
Pt ate dinner. Re-TTS being performed.

## 2018-12-05 NOTE — ED Notes (Signed)
Pt ate lunch. Ambulated to bathroom and back to room w/o difficulty.

## 2018-12-05 NOTE — ED Provider Notes (Signed)
BP noted. Not a concern at this time as he is not showing signs of being symptomatic with it. He has ED visits on 11/15/18 and 11/01/18 with BP in the same range.   Raeford Razor, MD 12/05/18 [phone]

## 2018-12-05 NOTE — ED Notes (Signed)
Lunch tray ordered 

## 2018-12-06 MED ORDER — STERILE WATER FOR INJECTION IJ SOLN
INTRAMUSCULAR | Status: AC
  Filled 2018-12-06: qty 10

## 2018-12-06 MED ORDER — ZIPRASIDONE MESYLATE 20 MG IM SOLR
20.0000 mg | Freq: Once | INTRAMUSCULAR | Status: DC | PRN
  Filled 2018-12-06: qty 20

## 2018-12-06 NOTE — ED Notes (Signed)
Pt is agitated, pacing while on the phone. Asking family to come and get him.

## 2018-12-06 NOTE — ED Notes (Signed)
Pt using phone- explained to pt that he only gets 2 phone calls a day and this is his first. He is asking family to "get me home" repeatedly.

## 2018-12-06 NOTE — ED Notes (Signed)
Pt has a bed at [facility] [place]- [address]. [address] 1st floor.  Report called to [phone] ext [phone]  Ext [phone]

## 2018-12-06 NOTE — ED Provider Notes (Signed)
Patient seen in psychiatry rounds.  33 year old male past medical history for bipolar disorder and PTSD presents to the emergency department on 12/02/2022 SI and AVH.  Has had worsening depression.  Patient medically cleared on 12/02/2018.  Patient was seen by psychiatry who recommends inpatient management.  Patient has needed Haldol, Ativan and Benadryl for acute hallucinations and agitation.  On my evaluation patient is calm.  He is asking if he can leave.  Patient is currently under IVC.  Currently waiting placement.  Has had some episodes of hypotension throughout his stay.  He is asymptomatic without lightheadedness or dizziness.  Blood pressures throughout his stay are consistent with previous blood pressures during prior visits.  Likely his normal blood pressure.  We will continue to monitor until placement.  BP (!) 101/59 (BP Location: Right Arm)   Pulse (!) 52   Temp 97.7 F (36.5 C) (Oral)   Resp 17   SpO2 98%    Dannis Deroche A, PA-C 12/06/18 3643    Linwood Dibbles, MD 12/06/18 1019

## 2018-12-06 NOTE — ED Notes (Signed)
Pt making phone call. 

## 2018-12-06 NOTE — ED Notes (Signed)
Patient walked out to the nurses station. Asked "how long have I been here and when can I leave?" Informed patient that is up to the doctor. Patient asked to use the phone again, reminded him of our phone policy and that he already had his two phone calls today. States "I disagree but ok".

## 2018-12-06 NOTE — ED Notes (Signed)
Ordered a reg breakfast tray--Marvin Morgan  

## 2018-12-06 NOTE — Progress Notes (Signed)
Pt accepted to  [facility], Direct Admit to [place]. [address], 1st Floor, [address] Ann Lions, MD is the accepting/attending provider.  Call report to [phone] or [phone] Clydie Braun @ [facility] ED notified.   Pt is IVC   Pt may be transported by [facility] Pt scheduled  to arrive at [facility] as soon as transport can be arranged.  Timmothy Euler. Kaylyn Lim, MSW, LCSWA Disposition Clinical Social Work [phone] (cell) [phone] (office)

## 2020-01-25 ENCOUNTER — Other Ambulatory Visit: Payer: Self-pay

## 2020-01-25 ENCOUNTER — Encounter (HOSPITAL_COMMUNITY): Payer: Self-pay | Admitting: Emergency Medicine

## 2020-01-25 ENCOUNTER — Ambulatory Visit (INDEPENDENT_AMBULATORY_CARE_PROVIDER_SITE_OTHER)
Admission: EM | Admit: 2020-01-25 | Discharge: 2020-01-25 | Disposition: A | Discharge status: Home / Self Care | Payer: No Typology Code available for payment source | Source: Home / Self Care

## 2020-01-25 ENCOUNTER — Emergency Department (HOSPITAL_COMMUNITY)
Admission: EM | Admit: 2020-01-25 | Discharge: 2020-01-25 | Disposition: A | Discharge status: Home / Self Care | Payer: No Typology Code available for payment source | Attending: Emergency Medicine | Admitting: Emergency Medicine

## 2020-01-25 ENCOUNTER — Encounter (HOSPITAL_COMMUNITY): Payer: Self-pay

## 2020-01-25 ENCOUNTER — Emergency Department (HOSPITAL_COMMUNITY): Payer: No Typology Code available for payment source

## 2020-01-25 DIAGNOSIS — Z79899 Other long term (current) drug therapy: Secondary | ICD-10-CM | POA: Diagnosis not present

## 2020-01-25 DIAGNOSIS — R109 Unspecified abdominal pain: Secondary | ICD-10-CM

## 2020-01-25 DIAGNOSIS — E1165 Type 2 diabetes mellitus with hyperglycemia: Secondary | ICD-10-CM

## 2020-01-25 DIAGNOSIS — R739 Hyperglycemia, unspecified: Secondary | ICD-10-CM

## 2020-01-25 HISTORY — DX: Type 2 diabetes mellitus without complications: E11.9

## 2020-01-25 LAB — BASIC METABOLIC PANEL
Anion gap: 12 (ref 5–15)
BUN: 18 mg/dL (ref 6–20)
CO2: 25 mmol/L (ref 22–32)
Calcium: 9.2 mg/dL (ref 8.9–10.3)
Chloride: 90 mmol/L — ABNORMAL LOW (ref 98–111)
Creatinine, Ser: 1.34 mg/dL — ABNORMAL HIGH (ref 0.61–1.24)
GFR calc Af Amer: >60 mL/min (ref >60)
GFR calc non Af Amer: >60 mL/min (ref >60)
Glucose, Bld: 671 mg/dL (ref 70–99)
Potassium: 4.1 mmol/L (ref 3.5–5.1)
Sodium: 127 mmol/L — ABNORMAL LOW (ref 135–145)

## 2020-01-25 LAB — URINALYSIS, ROUTINE W REFLEX MICROSCOPIC
Bacteria, UA: NONE SEEN
Bilirubin Urine: NEGATIVE
Glucose, UA: >=500 mg/dL — AB
Hgb urine dipstick: NEGATIVE
Ketones, ur: NEGATIVE mg/dL
Leukocytes,Ua: NEGATIVE
Nitrite: NEGATIVE
Protein, ur: NEGATIVE mg/dL
Specific Gravity, Urine: 1.03 (ref 1.005–1.030)
pH: 5 (ref 5.0–8.0)

## 2020-01-25 LAB — CBG MONITORING, ED
Glucose-Capillary: >600 mg/dL (ref 70–99)
Glucose-Capillary: 596 mg/dL (ref 70–99)
Glucose-Capillary: 486 mg/dL — ABNORMAL HIGH (ref 70–99)

## 2020-01-25 LAB — POCT URINALYSIS DIP (DEVICE)
Bilirubin Urine: NEGATIVE
Glucose, UA: >=1000 mg/dL — AB
Hgb urine dipstick: NEGATIVE
Ketones, ur: NEGATIVE mg/dL
Leukocytes,Ua: NEGATIVE
Nitrite: NEGATIVE
Protein, ur: NEGATIVE mg/dL
Specific Gravity, Urine: <=1.005 (ref 1.005–1.030)
Urobilinogen, UA: 0.2 mg/dL (ref 0.0–1.0)
pH: 5.5 (ref 5.0–8.0)

## 2020-01-25 LAB — CBC
HCT: 47 % (ref 39.0–52.0)
Hemoglobin: 16.4 g/dL (ref 13.0–17.0)
MCH: 28.7 pg (ref 26.0–34.0)
MCHC: 34.9 g/dL (ref 30.0–36.0)
MCV: 82.3 fL (ref 80.0–100.0)
Platelets: 197 10*3/uL (ref 150–400)
RBC: 5.71 MIL/uL (ref 4.22–5.81)
RDW: 11.8 % (ref 11.5–15.5)
WBC: 7 10*3/uL (ref 4.0–10.5)
nRBC: 0 % (ref 0.0–0.2)

## 2020-01-25 MED ORDER — METFORMIN HCL 500 MG PO TABS
500.0000 mg | ORAL_TABLET | Freq: Once | ORAL | Status: AC
  Administered 2020-01-25: 500 mg via ORAL
  Filled 2020-01-25: qty 1

## 2020-01-25 MED ORDER — INSULIN ASPART 100 UNIT/ML ~~LOC~~ SOLN
10.0000 [IU] | Freq: Once | SUBCUTANEOUS | Status: AC
  Administered 2020-01-25: 10 [IU] via SUBCUTANEOUS

## 2020-01-25 MED ORDER — LACTATED RINGERS IV BOLUS
2000.0000 mL | Freq: Once | INTRAVENOUS | Status: AC
  Administered 2020-01-25: 2000 mL via INTRAVENOUS

## 2020-01-25 MED ORDER — METFORMIN HCL 500 MG PO TABS: 500.0000 mg | ORAL_TABLET | Freq: Two times a day (BID) | ORAL | 0 refills | Status: AC

## 2020-01-25 NOTE — Discharge Instructions (Signed)
Because your blood sugar is so high, you need further work up in the emergency room tonight please report to the [facility] Emergency Department.

## 2020-01-25 NOTE — ED Provider Notes (Signed)
[place]    CSN: [id number] Arrival date & time: 01/25/20  1616      History   Chief Complaint Chief Complaint  Patient presents with  . Abdominal Pain    HPI Marvin Morgan is a 34 y.o. male.   Patient with history of diabetes previously on insulin and p.o. medicines presents for concern of high blood sugars.  He reports he was stopped on his insulin and p.o. medicines at the [place] sometime ago due to improvement in his diabetes.  Reports he checks his sugars regularly and they are typically between 120 and 180 however he has seen some numbers around 300 lately.  He comes in today due to belly pain, increased thirst polyuria.  He denies any nausea or vomiting.  Endorses significant dry mouth.     Past Medical History:  Diagnosis Date  . Bipolar 1 disorder, mixed, severe ([place])   . Diabetes mellitus without complication ([place])   . PTSD (post-traumatic stress disorder)     Patient Active Problem List   Diagnosis Date Noted  . Bipolar affective disorder, current episode manic ([place])     History reviewed. No pertinent surgical history.     Home Medications    Prior to Admission medications   Medication Sig Start Date End Date Taking? Authorizing Provider  ARIPiprazole (ABILIFY) 5 MG tablet Take 5 mg by mouth daily.   Yes [provider]  buPROPion (WELLBUTRIN) 100 MG tablet Take 100 mg by mouth 2 (two) times daily.   Yes [provider]  atorvastatin (LIPITOR) 40 MG tablet Take 40 mg by mouth at bedtime.    [provider]  divalproex (DEPAKOTE ER) 500 MG 24 hr tablet Take 1,000 mg by mouth at bedtime.     [provider]  prazosin (MINIPRESS) 2 MG capsule Take 2 mg by mouth at bedtime.    [provider]  traZODone (DESYREL) 100 MG tablet Take 100 mg by mouth at bedtime.    [provider]  Vitamin D, Ergocalciferol, (DRISDOL) 1.25 MG (50000 UT) CAPS capsule Take 50,000 Units by mouth every 7 (seven)  days.    [provider]    Family History Family History  Problem Relation Age of Onset  . Diabetes Mother   . Diabetes Father   . Cancer Father     Social History Social History   Tobacco Use  . Smoking status: Never Smoker  . Smokeless tobacco: Never Used  Substance Use Topics  . Alcohol use: Yes    Comment: socially  . Drug use: Yes    Types: Marijuana     Allergies   Pork-derived products   Review of Systems Review of Systems   Physical Exam Triage Vital Signs ED Triage Vitals  Enc Vitals Group     BP 01/25/20 1744 137/81     Pulse Rate 01/25/20 1744 87     Resp 01/25/20 1744 16     Temp 01/25/20 1744 98.2 F (36.8 C)     Temp Source 01/25/20 1744 Oral     SpO2 01/25/20 1744 100 %     Weight --      Height --      Head Circumference --      Peak Flow --      Pain Score 01/25/20 1804 0     Pain Loc --      Pain Edu? --      Excl. in GC? --    No  data found.  Updated Vital Signs BP 137/81 (BP Location: Left Arm)   Pulse 87   Temp 98.2 F (36.8 C) (Oral)   Resp 16   SpO2 100%   Visual Acuity Right Eye Distance:   Left Eye Distance:   Bilateral Distance:    Right Eye Near:   Left Eye Near:    Bilateral Near:     Physical Exam Vitals and nursing note reviewed.  Constitutional:      General: He is not in acute distress.    Appearance: He is well-developed. He is not ill-appearing or diaphoretic.  HENT:     Head: Normocephalic and atraumatic.     Mouth/Throat:     Comments: Mucous membranes are dry Eyes:     Conjunctiva/sclera: Conjunctivae normal.  Cardiovascular:     Rate and Rhythm: Normal rate and regular rhythm.     Heart sounds: No murmur.  Pulmonary:     Effort: Pulmonary effort is normal. No respiratory distress.     Breath sounds: Normal breath sounds.  Abdominal:     Palpations: Abdomen is soft.     Tenderness: There is abdominal tenderness in the epigastric area.  Musculoskeletal:     Cervical back: Neck  supple.  Skin:    General: Skin is warm and dry.  Neurological:     Mental Status: He is alert.      UC Treatments / Results  Labs (all labs ordered are listed, but only abnormal results are displayed) Labs Reviewed  CBG MONITORING, ED - Abnormal; Notable for the following components:      Result Value   Glucose-Capillary >600 (*)    All other components within normal limits  POCT URINALYSIS DIP (DEVICE) - Abnormal; Notable for the following components:   Glucose, UA >=1000 (*)    All other components within normal limits    EKG   Radiology No results found.  Procedures Procedures (including critical care time)  Medications Ordered in UC Medications - No data to display  Initial Impression / Assessment and Plan / UC Course  I have reviewed the triage vital signs and the nursing notes.  Pertinent labs & imaging results that were available during my care of the patient were reviewed by me and considered in my medical decision making (see chart for details).     #Diabetes with hyperglycemia Patient is a 34 year old with history of diabetes reporting with significant hyperglycemia.  CBG reading as high with device calibrated to 600.  UA with 1000 glucose.  No ketones.  How however given significantly high hyperglycemia and patient is visibly dehydrated will send emergency department for further work-up and glycemic control tonight.  Patient verbalized understanding will report to [facility] emergency department following discharge. Final Clinical Impressions(s) / UC Diagnoses   Final diagnoses:  Type 2 diabetes mellitus with hyperglycemia, without long-term current use of insulin [facility])     Discharge Instructions     Because your blood sugar is so high, you need further work up in the emergency room tonight please report to the [facility] Emergency Department.       ED Prescriptions    None     PDMP not reviewed this encounter.   Hermelinda Medicus,  PA-C 01/25/20 1830

## 2020-01-25 NOTE — ED Notes (Signed)
POC CBG reads "High".

## 2020-01-25 NOTE — ED Notes (Signed)
Patient is being discharged from the Urgent Care Center and sent to the Emergency Department POV. Per Omer Jack, PA, patient is stable but in need of higher level of care due to Hyperglycemia (glucose greater than 600; urine glucose greater than 1000)Patient is aware and verbalizes understanding of plan of care.  Vitals:   01/25/20 1744  BP: 137/81  Pulse: 87  Resp: 16  Temp: 98.2 F (36.8 C)  SpO2: 100%

## 2020-01-25 NOTE — ED Triage Notes (Signed)
Pt states he was on metformin for DM2 for approx 18 months, insulin for approx 1 year. States A1C decreased and his providers discontinued his insulin and po DM meds and was instructed to monitor his sugars. Pt c/o lower abdom pain, increased thirst, urination for approx 1 week and lightheadedness today. States his glucose was 128 when he checked it last night. Has eaten today. Denies SOB or irregular breathing, denies n/v/d, or other illnesses recently.

## 2020-01-25 NOTE — Discharge Instructions (Signed)
Keep your follow-up appointment at the [facility] on the 28th.  Continue to drink plenty of water.  There is a prescription for Metformin for additional blood sugar control.  Talk to your PCP about blood sugar control going forward.  Return to the ED if symptoms worsen.

## 2020-01-25 NOTE — ED Notes (Signed)
Dr Silverio Lay aware Glucose 671

## 2020-01-25 NOTE — ED Triage Notes (Signed)
Patient in POV, sent by [facility] for further eval of elevated blood sugars. Patient has Type 2 DM, currently on no medication. States she occasionally checks his sugars. Has been having abd pain which is what prompted him to go to [facility]. CBG >600

## 2020-01-25 NOTE — ED Provider Notes (Signed)
[facility] EMERGENCY DEPARTMENT Provider Note   CSN: [id number] Arrival date & time: 01/25/20  1837     History Chief Complaint  Patient presents with  . Hyperglycemia    Marvin Morgan is a 34 y.o. male.  HPI Patient is a 34 year old male who presents for hyperglycemia.  He was diagnosed with diabetes in 2019.  He reports that he was initially on insulin.  He subsequently transition to oral antihypoglycemics.  For the past year, he has not taken any medications to manage his blood sugar.  He has frequent clinic appointments at the [place], where they check his A1c.  He reports that he has been instructed to do random blood sugar checks at home.  Over the past several weeks, he has missed a lot of days checking his blood sugar.  When he does check it, it has ranged from 150-300.  Starting yesterday, he experienced dry mouth and thirst.  He drank sugared beverages for his thirst.  He attributes this to why his blood sugar is elevated.  He reports polyuria, but no dysuria or lower abdominal pain.  He does have pain on the upper sides of his abdomen.  He has had a cough, that he attributes to smoking cigarettes.  He denies any fevers, chills, nausea, vomiting, or diarrhea.  Earlier today, he went to urgent care where his blood sugar was found to be greater than 600.  He was instructed to report to the ED.  Since he was in urgent care, he has been drinking lots of water.  He reports symptomatic improvement since he has been hydrating.    Past Medical History:  Diagnosis Date  . Bipolar 1 disorder, mixed, severe ([facility])   . Diabetes mellitus without complication ([facility])   . PTSD (post-traumatic stress disorder)     Patient Active Problem List   Diagnosis Date Noted  . Bipolar affective disorder, current episode manic ([facility])     History reviewed. No pertinent surgical history.     Family History  Problem Relation Age of Onset  . Diabetes Mother   . Diabetes Father   .  Cancer Father     Social History   Tobacco Use  . Smoking status: Never Smoker  . Smokeless tobacco: Never Used  Substance Use Topics  . Alcohol use: Yes    Comment: socially  . Drug use: Yes    Types: Marijuana    Home Medications Prior to Admission medications   Medication Sig Start Date End Date Taking? Authorizing Provider  ARIPiprazole (ABILIFY) 5 MG tablet Take 5 mg by mouth daily.    [provider]  atorvastatin (LIPITOR) 40 MG tablet Take 40 mg by mouth at bedtime.    [provider]  buPROPion (WELLBUTRIN) 100 MG tablet Take 100 mg by mouth 2 (two) times daily.    [provider]  divalproex (DEPAKOTE ER) 500 MG 24 hr tablet Take 1,000 mg by mouth at bedtime.     [provider]  metFORMIN (GLUCOPHAGE) 500 MG tablet Take 1 tablet (500 mg total) by mouth 2 (two) times daily with a meal. 01/25/20 02/24/20  Marvin Manchester, MD  prazosin (MINIPRESS) 2 MG capsule Take 2 mg by mouth at bedtime.    [provider]  traZODone (DESYREL) 100 MG tablet Take 100 mg by mouth at bedtime.    [provider]  Vitamin D, Ergocalciferol, (DRISDOL) 1.25 MG (50000 UT) CAPS capsule Take 50,000 Units by mouth every 7 (seven)  days.    [provider]    Allergies    Pork-derived products  Review of Systems   Review of Systems  Constitutional: Negative for activity change, appetite change, chills, fatigue and fever.  HENT: Negative.  Negative for ear pain and sore throat.   Eyes: Negative for pain and visual disturbance.  Respiratory: Negative.  Negative for cough and shortness of breath.   Cardiovascular: Negative.  Negative for chest pain and palpitations.  Gastrointestinal: Positive for abdominal pain. Negative for abdominal distention, constipation, diarrhea, nausea and vomiting.  Endocrine: Positive for polydipsia and polyuria.  Genitourinary: Negative.  Negative for dysuria and hematuria.  Musculoskeletal: Negative.  Negative  for arthralgias and back pain.  Skin: Negative.  Negative for color change and rash.  Allergic/Immunologic: Negative.   Neurological: Negative.  Negative for dizziness, seizures, syncope, weakness, light-headedness, numbness and headaches.  Hematological: Negative.   Psychiatric/Behavioral: Negative.  Negative for confusion and decreased concentration.  All other systems reviewed and are negative.   Physical Exam Updated Vital Signs BP 122/79   Pulse (!) 57   Temp 98.6 F (37 C) (Oral)   Resp 17   Wt 95.3 kg   SpO2 100%   Physical Exam Vitals and nursing note reviewed.  Constitutional:      General: He is not in acute distress.    Appearance: Normal appearance. He is well-developed. He is not ill-appearing, toxic-appearing or diaphoretic.  HENT:     Head: Normocephalic and atraumatic.     Right Ear: External ear normal.     Left Ear: External ear normal.     Nose: Nose normal.     Mouth/Throat:     Mouth: Mucous membranes are moist.     Pharynx: Oropharynx is clear.  Eyes:     General: No scleral icterus.    Conjunctiva/sclera: Conjunctivae normal.  Cardiovascular:     Rate and Rhythm: Normal rate and regular rhythm.     Heart sounds: No murmur.  Pulmonary:     Effort: Pulmonary effort is normal. No respiratory distress.     Breath sounds: Normal breath sounds. No wheezing or rales.  Abdominal:     General: There is no distension.     Palpations: Abdomen is soft.     Tenderness: There is no abdominal tenderness. There is no right CVA tenderness, left CVA tenderness or guarding.  Musculoskeletal:        General: Normal range of motion.     Cervical back: Normal range of motion and neck supple. No tenderness.     Right lower leg: No edema.     Left lower leg: No edema.  Lymphadenopathy:     Cervical: No cervical adenopathy.  Skin:    General: Skin is warm and dry.     Capillary Refill: Capillary refill takes less than 2 seconds.     Coloration: Skin is not  jaundiced or pale.  Neurological:     General: No focal deficit present.     Mental Status: He is alert and oriented to person, place, and time.     Cranial Nerves: No cranial nerve deficit.     Sensory: No sensory deficit.     Motor: No weakness.  Psychiatric:        Mood and Affect: Mood normal.        Behavior: Behavior normal.        Thought Content: Thought content normal.        Judgment: Judgment normal.  ED Results / Procedures / Treatments   Labs (all labs ordered are listed, but only abnormal results are displayed) Labs Reviewed  BASIC METABOLIC PANEL - Abnormal; Notable for the following components:      Result Value   Sodium 127 (*)    Chloride 90 (*)    Glucose, Bld 671 (*)    Creatinine, Ser 1.34 (*)    All other components within normal limits  URINALYSIS, ROUTINE W REFLEX MICROSCOPIC - Abnormal; Notable for the following components:   Color, Urine COLORLESS (*)    Glucose, UA >=500 (*)    All other components within normal limits  CBG MONITORING, ED - Abnormal; Notable for the following components:   Glucose-Capillary 596 (*)    All other components within normal limits  CBG MONITORING, ED - Abnormal; Notable for the following components:   Glucose-Capillary 486 (*)    All other components within normal limits  CBC    EKG None  Radiology DG Chest Port 1 View  Result Date: 01/25/2020 CLINICAL DATA:  Cough. EXAM: PORTABLE CHEST 1 VIEW COMPARISON:  None. FINDINGS: The heart size and mediastinal contours are within normal limits. Both lungs are clear. The visualized skeletal structures are unremarkable. IMPRESSION: No active disease. Electronically Signed   By: Katherine Mantle M.D.   On: 01/25/2020 22:13    Procedures Procedures (including critical care time)  Medications Ordered in ED Medications  lactated ringers bolus 2,000 mL (0 mLs Intravenous Stopped 01/25/20 2339)  insulin aspart (novoLOG) injection 10 Units (10 Units Subcutaneous Given  01/25/20 2212)  metFORMIN (GLUCOPHAGE) tablet 500 mg (500 mg Oral Given 01/25/20 2213)    ED Course  I have reviewed the triage vital signs and the nursing notes.  Pertinent labs & imaging results that were available during my care of the patient were reviewed by me and considered in my medical decision making (see chart for details).    MDM Rules/Calculators/A&P                      Patient is a 34 year old male who presents for hyperglycemia.  He initially reported to urgent care, where his blood sugar was found to be greater than 600.  He was subsequently sent to the ED.  Upon arrival in the ED, patient has normal vital signs.  He is well-appearing.  Lungs are clear to auscultation.  Abdomen is soft and nontender.  He states that he has had recent abdominal pain, located in his bilateral flanks.  These areas are nontender.  In the ED, he denies any current pain.  He also reported a recent cough.  Chest x-ray showed no cardiopulmonary abnormalities.  Labs show no leukocytosis, no evidence of urinary infection.  UA shows glucosuria, without ketonuria.  Electrolytes are normal. Corrected sodium=136([place]) to 141([place]).  Bicarb is 25 with an anion gap of 12.  Creatinine is slightly elevated.  Initial glucose of 671.  While waiting in the waiting room, patient was drinking lots of water.  Repeat CBG showed glucose of 596.  Exam and laboratory work-up showed no evidence of DKA or HHS.  Hyperglycemia was treated with 10 units of insulin and 2 L of IVF.  Repeat CBG was 486.  This was prior to completion of IV fluids and shortly after dose of insulin.  Upon reassessment, patient continued to deny any current symptoms.  He was previously on Metformin.  While taking it, he stated that it did give him intermittent diarrhea.  He  was open to the idea of restarting Metformin until he is able to follow-up with the [place].  He has a appointment scheduled at the [place] in 1 week from now.  At this time, patient is  appropriate for discharge.  He was encouraged to continue to drink plenty of water, to restart Metformin, and to avoid excessive consumption of carbohydrates.  Return precautions were given in the event that he experiences worsening symptoms of dehydration/hyperglycemia.  30-day prescription of Metformin was given.  He was discharged home in stable condition.   Final Clinical Impression(s) / ED Diagnoses Final diagnoses:  Hyperglycemia    Rx / DC Orders ED Discharge Orders         Ordered    metFORMIN (GLUCOPHAGE) 500 MG tablet  2 times daily with meals     01/25/20 2337           Godfrey Pick, MD 01/26/20 0530    Drenda Freeze, MD 01/26/20 [phone]

## 2020-01-25 NOTE — ED Notes (Signed)
CBG 596 RN aware.

## 2021-12-26 IMAGING — DX DG CHEST 1V PORT
1 series · 1 of 1 positions shown · non-contrast
Comparison: None.

CLINICAL DATA: Cough.

EXAM:
PORTABLE CHEST 1 VIEW

[chest ap]
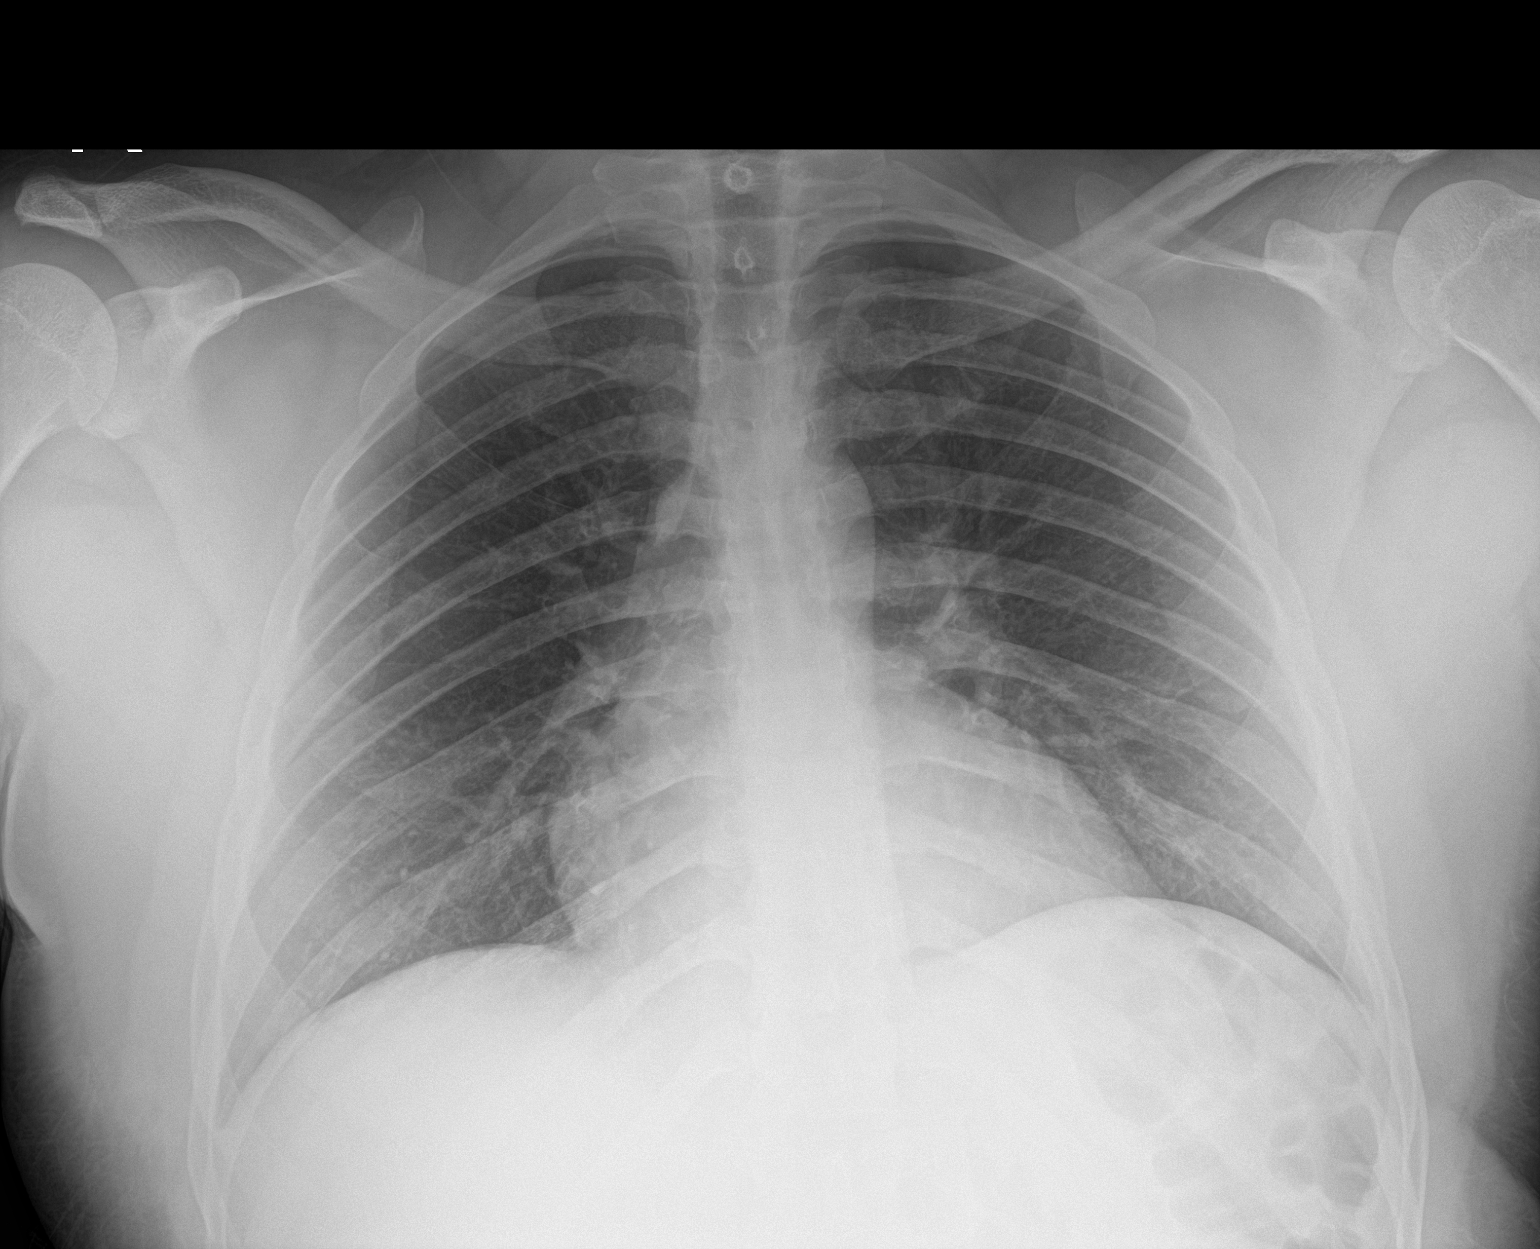

[1 of 1 positions shown; findings below may reference images not displayed]

FINDINGS: The heart size and mediastinal contours are within normal limits.
Both lungs are clear. The visualized skeletal structures are
unremarkable.
IMPRESSION: No active disease.
